# Patient Record
Sex: Male | Born: 1990 | Race: White | Hispanic: No | Marital: Single | Smoking: Never smoker
Health system: Southern US, Community
[De-identification: ages and names within clinical notes are randomized; demographics above are authoritative.]

## PROBLEM LIST (undated history)

## (undated) DIAGNOSIS — K219 Gastro-esophageal reflux disease without esophagitis: Secondary | ICD-10-CM

## (undated) DIAGNOSIS — F419 Anxiety disorder, unspecified: Secondary | ICD-10-CM

## (undated) DIAGNOSIS — G4700 Insomnia, unspecified: Secondary | ICD-10-CM

## (undated) DIAGNOSIS — T7840XA Allergy, unspecified, initial encounter: Secondary | ICD-10-CM

## (undated) HISTORY — DX: Insomnia, unspecified: G47.00

## (undated) HISTORY — DX: Anxiety disorder, unspecified: F41.9

## (undated) HISTORY — PX: APPENDECTOMY: SHX54

## (undated) HISTORY — DX: Allergy, unspecified, initial encounter: T78.40XA

---
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
## 2010-03-11 ENCOUNTER — Encounter: Payer: Self-pay | Admitting: Family Medicine

## 2015-02-25 ENCOUNTER — Encounter (HOSPITAL_BASED_OUTPATIENT_CLINIC_OR_DEPARTMENT_OTHER): Payer: Self-pay | Admitting: *Deleted

## 2015-02-25 ENCOUNTER — Emergency Department (HOSPITAL_BASED_OUTPATIENT_CLINIC_OR_DEPARTMENT_OTHER): Payer: Managed Care, Other (non HMO)

## 2015-02-25 ENCOUNTER — Emergency Department (HOSPITAL_BASED_OUTPATIENT_CLINIC_OR_DEPARTMENT_OTHER)
Admission: EM | Admit: 2015-02-25 | Discharge: 2015-02-25 | Disposition: A | Discharge status: Home / Self Care | Payer: Managed Care, Other (non HMO) | Attending: Emergency Medicine | Admitting: Emergency Medicine

## 2015-02-25 DIAGNOSIS — R112 Nausea with vomiting, unspecified: Secondary | ICD-10-CM | POA: Diagnosis not present

## 2015-02-25 DIAGNOSIS — R000 Tachycardia, unspecified: Secondary | ICD-10-CM | POA: Insufficient documentation

## 2015-02-25 DIAGNOSIS — Z87891 Personal history of nicotine dependence: Secondary | ICD-10-CM | POA: Diagnosis not present

## 2015-02-25 DIAGNOSIS — R509 Fever, unspecified: Secondary | ICD-10-CM | POA: Insufficient documentation

## 2015-02-25 DIAGNOSIS — R197 Diarrhea, unspecified: Secondary | ICD-10-CM | POA: Insufficient documentation

## 2015-02-25 LAB — COMPREHENSIVE METABOLIC PANEL
ALK PHOS: 41 U/L (ref 38–126)
ALT: 46 U/L (ref 17–63)
ANION GAP: 11 (ref 5–15)
AST: 29 U/L (ref 15–41)
Albumin: 4.8 g/dL (ref 3.5–5.0)
BUN: 22 mg/dL — ABNORMAL HIGH (ref 6–20)
CALCIUM: 9.5 mg/dL (ref 8.9–10.3)
CHLORIDE: 102 mmol/L (ref 101–111)
CO2: 23 mmol/L (ref 22–32)
Creatinine, Ser: 0.89 mg/dL (ref 0.61–1.24)
GFR calc non Af Amer: >60 mL/min (ref >60)
Glucose, Bld: 133 mg/dL — ABNORMAL HIGH (ref 65–99)
POTASSIUM: 3.8 mmol/L (ref 3.5–5.1)
SODIUM: 136 mmol/L (ref 135–145)
Total Bilirubin: 1.6 mg/dL — ABNORMAL HIGH (ref 0.3–1.2)
Total Protein: 8.5 g/dL — ABNORMAL HIGH (ref 6.5–8.1)

## 2015-02-25 LAB — URINALYSIS, ROUTINE W REFLEX MICROSCOPIC
Bilirubin Urine: NEGATIVE
Glucose, UA: NEGATIVE mg/dL
HGB URINE DIPSTICK: NEGATIVE
Ketones, ur: NEGATIVE mg/dL
Leukocytes, UA: NEGATIVE
Nitrite: NEGATIVE
Protein, ur: NEGATIVE mg/dL
SPECIFIC GRAVITY, URINE: 1.039 — AB (ref 1.005–1.030)
pH: 5.5 (ref 5.0–8.0)

## 2015-02-25 LAB — CBC
HEMATOCRIT: 47.1 % (ref 39.0–52.0)
HEMOGLOBIN: 15.8 g/dL (ref 13.0–17.0)
MCH: 27.9 pg (ref 26.0–34.0)
MCHC: 33.5 g/dL (ref 30.0–36.0)
MCV: 83.1 fL (ref 78.0–100.0)
Platelets: 441 10*3/uL — ABNORMAL HIGH (ref 150–400)
RBC: 5.67 MIL/uL (ref 4.22–5.81)
RDW: 12.5 % (ref 11.5–15.5)
WBC: 15.4 10*3/uL — ABNORMAL HIGH (ref 4.0–10.5)

## 2015-02-25 LAB — LIPASE, BLOOD: Lipase: 17 U/L (ref 11–51)

## 2015-02-25 MED ORDER — IOHEXOL 300 MG/ML  SOLN
25.0000 mL | Freq: Once | INTRAMUSCULAR | Status: AC | PRN
  Administered 2015-02-25: 25 mL via ORAL

## 2015-02-25 MED ORDER — SODIUM CHLORIDE 0.9 % IV BOLUS (SEPSIS)
2000.0000 mL | Freq: Once | INTRAVENOUS | Status: AC
  Administered 2015-02-25: 2000 mL via INTRAVENOUS

## 2015-02-25 MED ORDER — ONDANSETRON HCL 4 MG PO TABS: 4.0000 mg | ORAL_TABLET | Freq: Four times a day (QID) | ORAL | Status: DC

## 2015-02-25 MED ORDER — MORPHINE SULFATE (PF) 4 MG/ML IV SOLN
4.0000 mg | Freq: Once | INTRAVENOUS | Status: AC
  Administered 2015-02-25: 4 mg via INTRAVENOUS
  Filled 2015-02-25: qty 1

## 2015-02-25 MED ORDER — ONDANSETRON HCL 4 MG/2ML IJ SOLN
4.0000 mg | Freq: Once | INTRAMUSCULAR | Status: AC
  Filled 2015-02-25: qty 2

## 2015-02-25 MED ORDER — ONDANSETRON HCL 4 MG/2ML IJ SOLN
4.0000 mg | Freq: Once | INTRAMUSCULAR | Status: AC | PRN
  Administered 2015-02-25: 4 mg via INTRAVENOUS
  Filled 2015-02-25: qty 2

## 2015-02-25 MED ORDER — IOHEXOL 300 MG/ML  SOLN
100.0000 mL | Freq: Once | INTRAMUSCULAR | Status: AC | PRN
  Administered 2015-02-25: 100 mL via INTRAVENOUS

## 2015-02-25 NOTE — Discharge Instructions (Signed)
Nausea and Vomiting  Nausea means you feel sick to your stomach. Throwing up (vomiting) is a reflex where stomach contents come out of your mouth.  HOME CARE   · Take medicine as told by your doctor.  · Do not force yourself to eat. However, you do need to drink fluids.  · If you feel like eating, eat a normal diet as told by your doctor.    Eat rice, wheat, potatoes, bread, lean meats, yogurt, fruits, and vegetables.    Avoid high-fat foods.  · Drink enough fluids to keep your pee (urine) clear or pale yellow.  · Ask your doctor how to replace body fluid losses (rehydrate). Signs of body fluid loss (dehydration) include:    Feeling very thirsty.    Dry lips and mouth.    Feeling dizzy.    Dark pee.    Peeing less than normal.    Feeling confused.    Fast breathing or heart rate.  GET HELP RIGHT AWAY IF:   · You have blood in your throw up.  · You have black or bloody poop (stool).  · You have a bad headache or stiff neck.  · You feel confused.  · You have bad belly (abdominal) pain.  · You have chest pain or trouble breathing.  · You do not pee at least once every 8 hours.  · You have cold, clammy skin.  · You keep throwing up after 24 to 48 hours.  · You have a fever.  MAKE SURE YOU:   · Understand these instructions.  · Will watch your condition.  · Will get help right away if you are not doing well or get worse.     This information is not intended to replace advice given to you by your health care provider. Make sure you discuss any questions you have with your health care provider.     Document Released: 07/23/2007 Document Revised: 04/28/2011 Document Reviewed: 07/05/2010  Elsevier Interactive Patient Education ©2016 [employer]

## 2015-02-25 NOTE — ED Provider Notes (Signed)
CSN: [id number]     Arrival date & time 02/25/15  1818 History   First MD Initiated Contact with Patient 02/25/15 1934     Chief Complaint  Patient presents with  . Diarrhea     (Consider location/radiation/quality/duration/timing/severity/associated sxs/prior Treatment) Patient is a 25 y.o. male presenting with diarrhea and general illness. The history is provided by the patient.  Diarrhea Associated symptoms: fever and vomiting   Associated symptoms: no abdominal pain, no arthralgias, no chills, no headaches and no myalgias   Illness Severity:  Moderate Onset quality:  Gradual Duration:  2 days Timing:  Constant Progression:  Worsening Chronicity:  New Associated symptoms: diarrhea, fever, nausea and vomiting   Associated symptoms: no abdominal pain, no chest pain, no congestion, no headaches, no myalgias, no rash and no shortness of breath    25 yo M with a chief complaint of nausea vomiting diarrhea and fever. The started this morning. Is recurrent from about a month ago. Patient states last time he is started on antibiotics and had quick resolution of his symptoms. Has been feeling like he is going to pass out as well. Has tried eat and drink but without relief. Crampy abdominal pain. Feels that all over. Denies radiation. Denies dysuria or flank pain.  History reviewed. No pertinent past medical history. Past Surgical History  Procedure Laterality Date  . Appendectomy     No family history on file. Social History  Substance Use Topics  . Smoking status: Former Games developer  . Smokeless tobacco: Never Used  . Alcohol Use: Yes     Comment: denies current use    Review of Systems  Constitutional: Positive for fever. Negative for chills.  HENT: Negative for congestion and facial swelling.   Eyes: Negative for discharge and visual disturbance.  Respiratory: Negative for shortness of breath.   Cardiovascular: Negative for chest pain and palpitations.  Gastrointestinal: Positive  for nausea, vomiting and diarrhea. Negative for abdominal pain.  Musculoskeletal: Negative for myalgias and arthralgias.  Skin: Negative for color change and rash.  Neurological: Negative for tremors, syncope and headaches.  Psychiatric/Behavioral: Negative for confusion and dysphoric mood.      Allergies  Review of patient's allergies indicates no known allergies.  Home Medications   Prior to Admission medications   Medication Sig Start Date End Date Taking? Authorizing Provider  ondansetron (ZOFRAN) 4 MG tablet Take 1 tablet (4 mg total) by mouth every 6 (six) hours. 02/25/15   Melene Plan, DO   BP 110/56 mmHg  Pulse 108  Temp(Src) 98.6 F (37 C) (Oral)  Resp 18  Ht 5\' 10"  (1.778 m)  Wt 178 lb (80.74 kg)  BMI 25.54 kg/m2  SpO2 100% Physical Exam  Constitutional: He is oriented to person, place, and time. He appears well-developed and well-nourished.  HENT:  Head: Normocephalic and atraumatic.  Eyes: EOM are normal. Pupils are equal, round, and reactive to light.  Neck: Normal range of motion. Neck supple. No JVD present.  Cardiovascular: Regular rhythm.  Tachycardia present.  Exam reveals no gallop and no friction rub.   No murmur heard. Pulmonary/Chest: No respiratory distress. He has no wheezes.  Abdominal: He exhibits no distension. There is tenderness. There is no rebound and no guarding.    Musculoskeletal: Normal range of motion.  Neurological: He is alert and oriented to person, place, and time.  Skin: No rash noted. No pallor.  Psychiatric: He has a normal mood and affect. His behavior is normal.  Nursing note and vitals  reviewed.   ED Course  Procedures (including critical care time) Labs Review Labs Reviewed  COMPREHENSIVE METABOLIC PANEL - Abnormal; Notable for the following:    Glucose, Bld 133 (*)    BUN 22 (*)    Total Protein 8.5 (*)    Total Bilirubin 1.6 (*)    All other components within normal limits  CBC - Abnormal; Notable for the following:     WBC 15.4 (*)    Platelets 441 (*)    All other components within normal limits  URINALYSIS, ROUTINE W REFLEX MICROSCOPIC (NOT AT [facility]) - Abnormal; Notable for the following:    Color, Urine AMBER (*)    Specific Gravity, Urine 1.039 (*)    All other components within normal limits  LIPASE, BLOOD    Imaging Review Ct Abdomen Pelvis W Contrast  02/25/2015  CLINICAL DATA:  Left lower quadrant pain. Fever, vomiting, diarrhea and fatigue since this morning. EXAM: CT ABDOMEN AND PELVIS WITH CONTRAST TECHNIQUE: Multidetector CT imaging of the abdomen and pelvis was performed using the standard protocol following bolus administration of intravenous contrast. CONTRAST:  25mL OMNIPAQUE IOHEXOL 300 MG/ML SOLN, 100mL OMNIPAQUE IOHEXOL 300 MG/ML SOLN COMPARISON:  None. FINDINGS: Lower chest:  The included lung bases are clear. Liver: Diffusely decreased density, with minimal sparing about the gallbladder fossa. No focal lesion. Hepatobiliary: Gallbladder physiologically distended, no calcified stone. No biliary dilatation. Pancreas: No ductal dilatation or inflammation. Spleen: Normal in size and density.  Splenule noted inferiorly. Adrenal glands: No nodule. Kidneys: Symmetric renal enhancement and excretion. No hydronephrosis. No perinephric stranding. There is 1.5 cm simple cyst in the mid upper left kidney. Stomach/Bowel: Stomach physiologically distended. There are no dilated or thickened small bowel loops. Small volume of stool throughout the colon without colonic wall thickening. Probable minimal liquid stool in the transverse colon. No colonic inflammation. The appendix is not seen, surgically absent per report. Vascular/Lymphatic: No retroperitoneal adenopathy. Abdominal aorta is normal in caliber. Reproductive: Prostate gland normal in size. Bladder: Physiologically distended. No wall thickening. No bladder stones. Other: No free air, free fluid, or intra-abdominal fluid collection. No inguinal or  umbilical hernia. Musculoskeletal: There are no acute or suspicious osseous abnormalities. IMPRESSION: No acute abnormality in the abdomen/pelvis. Hepatic steatosis. Electronically Signed   By: Rubye OaksMelanie  Ehinger M.D.   On: 02/25/2015 22:06   I have personally reviewed and evaluated these images and lab results as part of my medical decision-making.   EKG Interpretation None      MDM   Final diagnoses:  Nausea vomiting and diarrhea    25 yo M with a chief complaint of nausea vomiting diarrhea and fever. Likely gastroenteritis based on history. However patient with significant left lower quadrant abdominal pain as well as tachycardia to the 120s on arrival. Will obtain a CT scan to evaluate for the possibility of Crohn's disease with recurrent event from one month ago. Laboratory evaluation unremarkable.  CT negative. Tachycardia improved with IV fluids. D/c home.  11:04 PM:  I have discussed the diagnosis/risks/treatment options with the patient and family and believe the pt to be eligible for discharge home to follow-up with PCP. We also discussed returning to the ED immediately if new or worsening sx occur. We discussed the sx which are most concerning (e.g., sudden worsening pain, fever, inability to tolerate by mouth) that necessitate immediate return. Medications administered to the patient during their visit and any new prescriptions provided to the patient are listed below.  Medications given during this  visit Medications  ondansetron (ZOFRAN) injection 4 mg (4 mg Intravenous Given 02/25/15 1940)  sodium chloride 0.9 % bolus 2,000 mL (0 mLs Intravenous Stopped 02/25/15 2049)  morphine 4 MG/ML injection 4 mg (4 mg Intravenous Given 02/25/15 2044)  ondansetron (ZOFRAN) injection 4 mg (0 mg Intravenous Duplicate 02/25/15 2236)  iohexol (OMNIPAQUE) 300 MG/ML solution 25 mL (25 mLs Oral Contrast Given 02/25/15 2020)  iohexol (OMNIPAQUE) 300 MG/ML solution 100 mL (100 mLs Intravenous Contrast Given  02/25/15 2143)    Discharge Medication List as of 02/25/2015 10:12 PM    START taking these medications   Details  ondansetron (ZOFRAN) 4 MG tablet Take 1 tablet (4 mg total) by mouth every 6 (six) hours., Starting 02/25/2015, Until Discontinued, Print        The patient appears reasonably screen and/or stabilized for discharge and I doubt any other medical condition or other [facility] requiring further screening, evaluation, or treatment in the ED at this time prior to discharge.    Melene Plan, DO 02/25/15 2304

## 2015-02-25 NOTE — ED Notes (Signed)
Pt reports fever, vomiting, diarrhea, fatigue since 1000 this morning. Pt has taken phenergan and amoxicillin from an old prescription today. Reports emesis x 2 and diarrhea multiple times

## 2015-03-23 ENCOUNTER — Emergency Department (HOSPITAL_COMMUNITY): Payer: Managed Care, Other (non HMO)

## 2015-03-23 ENCOUNTER — Encounter (HOSPITAL_COMMUNITY): Payer: Self-pay | Admitting: Emergency Medicine

## 2015-03-23 ENCOUNTER — Emergency Department (HOSPITAL_COMMUNITY)
Admission: EM | Admit: 2015-03-23 | Discharge: 2015-03-23 | Disposition: A | Discharge status: Home / Self Care | Payer: Managed Care, Other (non HMO) | Attending: Emergency Medicine | Admitting: Emergency Medicine

## 2015-03-23 DIAGNOSIS — Y990 Civilian activity done for income or pay: Secondary | ICD-10-CM | POA: Insufficient documentation

## 2015-03-23 DIAGNOSIS — W208XXA Other cause of strike by thrown, projected or falling object, initial encounter: Secondary | ICD-10-CM | POA: Insufficient documentation

## 2015-03-23 DIAGNOSIS — Y9289 Other specified places as the place of occurrence of the external cause: Secondary | ICD-10-CM | POA: Insufficient documentation

## 2015-03-23 DIAGNOSIS — S9031XA Contusion of right foot, initial encounter: Secondary | ICD-10-CM | POA: Insufficient documentation

## 2015-03-23 DIAGNOSIS — Z79899 Other long term (current) drug therapy: Secondary | ICD-10-CM | POA: Insufficient documentation

## 2015-03-23 DIAGNOSIS — S9001XA Contusion of right ankle, initial encounter: Secondary | ICD-10-CM | POA: Insufficient documentation

## 2015-03-23 DIAGNOSIS — Z87891 Personal history of nicotine dependence: Secondary | ICD-10-CM | POA: Insufficient documentation

## 2015-03-23 DIAGNOSIS — Y9389 Activity, other specified: Secondary | ICD-10-CM | POA: Insufficient documentation

## 2015-03-23 NOTE — ED Notes (Signed)
Patient states he was at work and was working with a load of lumber.   Patient states that the cart tipped and fell on R ankle/foot.  Patient states approximately 46 pieces of wood that landed on his ankle/foot. Denies other injuries.

## 2015-03-23 NOTE — ED Notes (Signed)
Pt placed in paper shorts

## 2015-03-23 NOTE — ED Provider Notes (Signed)
CSN: [id number]     Arrival date & time 03/23/15  0829 History  By signing my name below, I, Marica Otter, attest that this documentation has been prepared under the direction and in the presence of Cheri Fowler, PA-C. Electronically Signed: Marica Otter, ED Scribe. 03/23/2015. 9:36 AM.    Chief Complaint  Patient presents with  . Ankle Injury  . Foot Injury   The history is provided by the patient. No language interpreter was used.  PCP: No primary care provider on file. HPI Comments: Fred Santos is a 25 y.o. male who presents to the Emergency Department complaining of traumatic, sudden onset, severe, constant right ankle and foot pain onset today after a load of lumber fell on pt's right foot and ankle, causing him to invert his right ankle. Movement aggravates the pain. Associated Sx include trouble walking secondary to pain. Pt denies numbness or weakness.   History reviewed. No pertinent past medical history. Past Surgical History  Procedure Laterality Date  . Appendectomy     No family history on file. Social History  Substance Use Topics  . Smoking status: Former Games developer  . Smokeless tobacco: Never Used  . Alcohol Use: Yes     Comment: denies current use    Review of Systems  Constitutional: Negative for fever.  Musculoskeletal: Positive for arthralgias (right foot and ankle pain) and gait problem.  Neurological: Negative for numbness.  Psychiatric/Behavioral: Negative for confusion.  All other systems reviewed and are negative.  Allergies  Review of patient's allergies indicates no known allergies.  Home Medications   Prior to Admission medications   Medication Sig Start Date End Date Taking? Authorizing Provider  ondansetron (ZOFRAN) 4 MG tablet Take 1 tablet (4 mg total) by mouth every 6 (six) hours. 02/25/15  Yes Melene Plan, DO   Triage Vitals: BP 139/75 mmHg  Pulse 71  Temp(Src) 98 F (36.7 C) (Oral)  Resp 19  Ht  (1.778 m)  Wt 175 lb (79.379 kg)  BMI  25.11 kg/m2  SpO2 97% Physical Exam  Constitutional: He is oriented to person, place, and time. He appears well-developed and well-nourished.  HENT:  Head: Normocephalic and atraumatic.  Eyes: Conjunctivae are normal. No scleral icterus.  Neck: No tracheal deviation present.  Cardiovascular:  Cap refill less than 3 seconds distal to injury   Pulmonary/Chest: Effort normal. No respiratory distress.  Musculoskeletal: He exhibits tenderness.       Right ankle: He exhibits decreased range of motion (secondary to pain) and swelling (mild). He exhibits no ecchymosis. Tenderness (dorsal aspect of mid foot).       Right foot: There is decreased range of motion (secondary to pain) and tenderness.  Decreased ROM of right ankle with flexion and extension due to pain. Pt able to wiggle all toes without difficulty. Compartments are soft and compressible.   Neurological: He is alert and oriented to person, place, and time.  Grossly normal sensation. 5/5 strength RLE.    Skin: Skin is warm and dry.  Skin intact. No abrasions, lacerations, ecchymosis, bruising.   Psychiatric: He has a normal mood and affect. His behavior is normal.    ED Course  Procedures (including critical care time) DIAGNOSTIC STUDIES: Oxygen Saturation is 97% on ra, nl by my interpretation.    COORDINATION OF CARE: 9:17 AM: Discussed treatment plan which includes imaging and pain meds with pt at bedside; patient declined any pain meds at this time.   Imaging Review Dg Ankle Complete Right  03/23/2015  CLINICAL DATA:  Pain about the right lateral malleolus after the patient was struck in the ankle bowel multiple with or ribs. The patient heard a pop. Limited range of motion. The incident occurred today. Initial encounter. EXAM: RIGHT ANKLE - COMPLETE 3+ VIEW COMPARISON:  None. FINDINGS: There is no evidence of fracture, dislocation, or joint effusion. There is no evidence of arthropathy or other focal bone abnormality. Soft  tissues are unremarkable. IMPRESSION: Negative exam. Electronically Signed   By: Drusilla Kanner M.D.   On: 03/23/2015 09:19   Dg Foot Complete Right  03/23/2015  CLINICAL DATA:  Pain in the plantar arch, no known injury, initial encounter EXAM: RIGHT FOOT COMPLETE - 3+ VIEW COMPARISON:  None. FINDINGS: There is no evidence of fracture or dislocation. There is no evidence of arthropathy or other focal bone abnormality. Soft tissues are unremarkable. IMPRESSION: No acute abnormality noted. Electronically Signed   By: Alcide Clever M.D.   On: 03/23/2015 09:19   I have personally reviewed and evaluated these images as part of my medical decision-making.    MDM  Patient X-Ray negative for obvious fracture or dislocation. Pain managed in ED. Pt advised to follow up with orthopedics if symptoms persist for possibility of missed fracture diagnosis. Patient given brace and crutches while in ED, conservative therapy recommended and discussed. Patient will be dc home & is agreeable with above plan.  Final diagnoses:  Ankle contusion, right, initial encounter  Foot contusion, right, initial encounter    I personally performed the services described in this documentation, which was scribed in my presence. The recorded information has been reviewed and is accurate.    Cheri Fowler, PA-C 03/23/15 1610  Marily Memos, MD 03/23/15 [phone]

## 2015-03-23 NOTE — Discharge Instructions (Signed)

## 2017-03-24 ENCOUNTER — Emergency Department (HOSPITAL_COMMUNITY): Payer: Self-pay

## 2017-03-24 ENCOUNTER — Emergency Department (HOSPITAL_COMMUNITY)
Admission: EM | Admit: 2017-03-24 | Discharge: 2017-03-24 | Disposition: A | Discharge status: Home / Self Care | Payer: Self-pay | Attending: Emergency Medicine | Admitting: Emergency Medicine

## 2017-03-24 ENCOUNTER — Encounter (HOSPITAL_COMMUNITY): Payer: Self-pay | Admitting: Emergency Medicine

## 2017-03-24 DIAGNOSIS — Y92838 Other recreation area as the place of occurrence of the external cause: Secondary | ICD-10-CM | POA: Insufficient documentation

## 2017-03-24 DIAGNOSIS — S29019A Strain of muscle and tendon of unspecified wall of thorax, initial encounter: Secondary | ICD-10-CM | POA: Insufficient documentation

## 2017-03-24 DIAGNOSIS — T148XXA Other injury of unspecified body region, initial encounter: Secondary | ICD-10-CM

## 2017-03-24 DIAGNOSIS — Z87891 Personal history of nicotine dependence: Secondary | ICD-10-CM | POA: Insufficient documentation

## 2017-03-24 DIAGNOSIS — Y9321 Activity, ice skating: Secondary | ICD-10-CM | POA: Insufficient documentation

## 2017-03-24 DIAGNOSIS — Y998 Other external cause status: Secondary | ICD-10-CM | POA: Insufficient documentation

## 2017-03-24 MED ORDER — OXYCODONE HCL 5 MG PO TABS
5.0000 mg | ORAL_TABLET | Freq: Once | ORAL | Status: AC
  Administered 2017-03-24: 5 mg via ORAL
  Filled 2017-03-24: qty 1

## 2017-03-24 MED ORDER — METHOCARBAMOL 500 MG PO TABS: 500.0000 mg | ORAL_TABLET | Freq: Two times a day (BID) | ORAL | 0 refills | Status: DC | PRN

## 2017-03-24 MED ORDER — METHOCARBAMOL 500 MG PO TABS
500.0000 mg | ORAL_TABLET | Freq: Once | ORAL | Status: AC
  Administered 2017-03-24: 500 mg via ORAL
  Filled 2017-03-24: qty 1

## 2017-03-24 MED ORDER — ACETAMINOPHEN 500 MG PO TABS
1000.0000 mg | ORAL_TABLET | Freq: Once | ORAL | Status: AC
  Administered 2017-03-24: 1000 mg via ORAL
  Filled 2017-03-24: qty 2

## 2017-03-24 MED FILL — METHOCARBAMOL 500 MG TABS: 500 | 10 days supply | Qty: 20 | Fill #0

## 2017-03-24 NOTE — ED Triage Notes (Signed)
Patient presents ambulatory states 2 weeks ago he fell ice skating and had left sided rib cage pain. Patient states last night while working on cars he felt "a snap in my back." denies any bladder or bowel issues. Pain is mid back that shoots into lower back.

## 2017-03-24 NOTE — Discharge Instructions (Signed)
It was my pleasure taking care of you today!   Fortunately, your x-ray did not show any evidence of injury.  Continue taking ibuprofen as needed for pain.  I have given you a prescription for a muscle relaxer to take as needed as well.  Ice or heat to the affected area.  Follow-up with your primary care provider if symptoms are not improving in the next week.  Return to ER for new or worsening symptoms, any additional concerns.

## 2017-03-24 NOTE — ED Provider Notes (Signed)
[facility]-EMERGENCY DEPT Provider Note   CSN: [id number] Arrival date & time: 03/24/17  1024     History   Chief Complaint Chief Complaint  Patient presents with  . Back Pain    HPI Fred Santos is a 27 y.o. male.  The history is provided by the patient and medical records. No language interpreter was used.   Fred Santos is an otherwise healthy 27 y.o. male who presents to ED for persistent left-sided rib cage pain x 2 weeks. Patient states that he initial hurt the area when he fell on his left side while ice skating. He took 800mg  ibuprofen about 3x per day and felt as if things were healing well. Last night, he was working on his car when he felt a snap to the left lateral rib area. Pain radiated around to his mid back. Since then, pain worse with taking a deep breath and movement. He took ibuprofen just prior to arrival with little improvement. Patient denies neck pain. No fever, saddle anesthesia, weakness, numbness, urinary complaints including retention/incontinence.  History reviewed. No pertinent past medical history.  There are no active problems to display for this patient.   Past Surgical History:  Procedure Laterality Date  . APPENDECTOMY         Home Medications    Prior to Admission medications   Medication Sig Start Date End Date Taking? Authorizing Provider  ibuprofen (ADVIL,MOTRIN) 200 MG tablet Take 800 mg by mouth every 6 (six) hours as needed for mild pain, moderate pain or cramping.   Yes [provider]  methocarbamol (ROBAXIN) 500 MG tablet Take 1 tablet (500 mg total) by mouth 2 (two) times daily as needed for muscle spasms. 03/24/17   Batsheva Stevick, Chase Picket, PA-C  ondansetron (ZOFRAN) 4 MG tablet Take 1 tablet (4 mg total) by mouth every 6 (six) hours. Patient not taking: Reported on 03/24/2017 02/25/15   Melene Plan, DO    Family History No family history on file.  Social History Social History   Tobacco Use  . Smoking  status: Former Games developer  . Smokeless tobacco: Never Used  Substance Use Topics  . Alcohol use: Yes    Comment: denies current use  . Drug use: No     Allergies   Patient has no known allergies.   Review of Systems Review of Systems  Constitutional: Negative for fever.  Respiratory: Negative for cough and shortness of breath.   Cardiovascular: Negative for chest pain.  Gastrointestinal: Negative for abdominal pain, nausea and vomiting.  Genitourinary: Negative for difficulty urinating.  Musculoskeletal: Positive for arthralgias and myalgias. Negative for neck pain.  Skin: Negative for color change and wound.  Neurological: Negative for weakness and numbness.     Physical Exam Updated Vital Signs BP 134/87 (BP Location: Left Arm)   Pulse 71   Temp 98.2 F (36.8 C) (Oral)   Resp 16   SpO2 100%   Physical Exam  Constitutional: He appears well-developed and well-nourished. No distress.  HENT:  Head: Normocephalic and atraumatic.  Neck: Neck supple.  Cardiovascular: Normal rate, regular rhythm and normal heart sounds.  No murmur heard. Pulmonary/Chest: Effort normal and breath sounds normal. No respiratory distress. He has no wheezes. He has no rales.  Musculoskeletal: Normal range of motion.  Tenderness to palpation along left lateral rib cage. No crepitus or deformity appreciated. No overlying skin changes. No midline C/T/L spine tenderness. Straight leg raises negative bilaterally.  Neurological: He is alert.  All four  extremities neurovascularly intact.  Skin: Skin is warm and dry.  Nursing note and vitals reviewed.    ED Treatments / Results  Labs (all labs ordered are listed, but only abnormal results are displayed) Labs Reviewed - No data to display  EKG  EKG Interpretation None       Radiology Dg Ribs Unilateral W/chest Left  Result Date: 03/24/2017 CLINICAL DATA:  Fall 2 weeks ago. Complains of left posterior lower rib pain. BB was placed at the area  of concern. EXAM: LEFT RIBS AND CHEST - 3+ VIEW COMPARISON:  09/12/2016 FINDINGS: Both lungs are clear. Negative for a pneumothorax. Heart size is normal. Trachea is midline. Left ribs are intact without a fracture. IMPRESSION: Negative. Electronically Signed   By: Richarda OverlieAdam  Henn M.D.   On: 03/24/2017 13:19    Procedures Procedures (including critical care time)  Medications Ordered in ED Medications  methocarbamol (ROBAXIN) tablet 500 mg (500 mg Oral Given 03/24/17 1242)  acetaminophen (TYLENOL) tablet 1,000 mg (1,000 mg Oral Given 03/24/17 1242)  oxyCODONE (Oxy IR/ROXICODONE) immediate release tablet 5 mg (5 mg Oral Given 03/24/17 1242)     Initial Impression / Assessment and Plan / ED Course  I have reviewed the triage vital signs and the nursing notes.  Pertinent labs & imaging results that were available during my care of the patient were reviewed by me and considered in my medical decision making (see chart for details).    Fred Santos is a 27 y.o. male who presents to ED for left-sided rib cage pain after mechanical fall 2 weeks ago, which acutely worsened while working on his car last night.  No crepitus or deformity on exam, but is point tender to left rib cage.  No midline C/T/L-spine tenderness.  All extremities neurovascularly intact.  X-ray negative.  Will treat symptomatically and have patient follow-up with PCP if symptoms persist.  Reasons to return to ER discussed and all questions answered.   Final Clinical Impressions(s) / ED Diagnoses   Final diagnoses:  Muscle strain    ED Discharge Orders        Ordered    methocarbamol (ROBAXIN) 500 MG tablet  2 times daily PRN     03/24/17 1355       Zeniyah Peaster, Chase PicketJaime Pilcher, PA-C 03/24/17 1357    Linwood DibblesKnapp, Jon, MD 03/25/17 [phone]

## 2017-06-03 ENCOUNTER — Ambulatory Visit (HOSPITAL_COMMUNITY)
Admission: EM | Admit: 2017-06-03 | Discharge: 2017-06-03 | Disposition: A | Discharge status: Home / Self Care | Payer: Self-pay | Attending: Urgent Care | Admitting: Urgent Care

## 2017-06-03 ENCOUNTER — Encounter (HOSPITAL_COMMUNITY): Payer: Self-pay | Admitting: Family Medicine

## 2017-06-03 DIAGNOSIS — R531 Weakness: Secondary | ICD-10-CM

## 2017-06-03 DIAGNOSIS — G4700 Insomnia, unspecified: Secondary | ICD-10-CM

## 2017-06-03 DIAGNOSIS — R110 Nausea: Secondary | ICD-10-CM

## 2017-06-03 DIAGNOSIS — R519 Headache, unspecified: Secondary | ICD-10-CM

## 2017-06-03 DIAGNOSIS — R51 Headache: Secondary | ICD-10-CM

## 2017-06-03 LAB — POCT I-STAT, CHEM 8
BUN: 20 mg/dL (ref 6–20)
CREATININE: 0.9 mg/dL (ref 0.61–1.24)
Calcium, Ion: 1.19 mmol/L (ref 1.15–1.40)
Chloride: 96 mmol/L — ABNORMAL LOW (ref 101–111)
Glucose, Bld: 135 mg/dL — ABNORMAL HIGH (ref 65–99)
HEMATOCRIT: 42 % (ref 39.0–52.0)
HEMOGLOBIN: 14.3 g/dL (ref 13.0–17.0)
Potassium: 3.4 mmol/L — ABNORMAL LOW (ref 3.5–5.1)
SODIUM: 136 mmol/L (ref 135–145)
TCO2: 27 mmol/L (ref 22–32)

## 2017-06-03 MED ORDER — LORAZEPAM 0.5 MG PO TABS: 0.5000 mg | ORAL_TABLET | Freq: Every day | ORAL | 0 refills | Status: DC

## 2017-06-03 NOTE — ED Triage Notes (Addendum)
Pt was at work today and became very weak and had a headache. He had an instance at work last week where he passed out and then hit hi head.  sts that the blood sugar was 41 when EMS arrived. He went to [facility]. He is not a  Diabetic. sts he recently recovered from viral URI. He is very nauseous, weak and dizzy right now

## 2017-06-03 NOTE — ED Provider Notes (Signed)
MRN: [id number] DOB: Jul 03, 1990  Subjective:   Fred Santos is a 27 y.o. male presenting for malaise, weakness, dizziness, nausea without vomiting, mild headache all over.  Patient states that he is recovering from a viral URI as diagnosed at the hospital.  States that he was seen for passing out and was found to have hypoglycemia.  Patient is denying any medical diagnoses.  He reports that he eats 1-2 times a day.  Hydrates very well.  Patient reports that he has a long-standing history of insomnia worse in the past month.  Reports that he has been sleeping about 1 hour per night.  He is still working a full-time job.  Denies sinus pain, sore throat, ear pain, cough, chest pain, shortness of breath, wheezing, nausea, vomiting, abdominal pain, dysuria, hematuria, urinary frequency, rashes, numbness, tingling.  Overall patient states that he does not feel like himself, reports that he feels sick.  Denies history of HIV, diabetes, autoimmune disorder.  Denies family history of diabetes or autoimmune disorder, cancer.  He is not currently taking any medications and No Known Allergies.  History reviewed. No pertinent past medical history.   Past Surgical History:  Procedure Laterality Date  . APPENDECTOMY      Objective:   Vitals: BP 135/89   Pulse 74   Temp 98.5 F (36.9 C)   Resp 18   SpO2 99%   Physical Exam  Constitutional: He is oriented to person, place, and time. He appears well-developed and well-nourished.  HENT:  Mouth/Throat: Oropharynx is clear and moist.  Eyes: Pupils are equal, round, and reactive to light. EOM are normal. Right eye exhibits no discharge. Left eye exhibits no discharge.  Neck: Normal range of motion. Neck supple. No thyromegaly present.  Cardiovascular: Normal rate, regular rhythm and intact distal pulses. Exam reveals no gallop and no friction rub.  No murmur heard. Pulmonary/Chest: No respiratory distress. He has no wheezes. He has no rales.  Abdominal:  Soft. Bowel sounds are normal. He exhibits no distension and no mass. There is no tenderness. There is no rebound and no guarding.  Musculoskeletal: He exhibits no edema.  Neurological: He is alert and oriented to person, place, and time. He displays normal reflexes. No cranial nerve deficit. Coordination normal.  Skin: Skin is warm and dry. Capillary refill takes less than 2 seconds.  Psychiatric:  Appears very lethargic.    Results for orders placed or performed during the hospital encounter of 06/03/17 (from the past 24 hour(s))  I-STAT, chem 8     Status: Abnormal   Collection Time: 06/03/17  7:41 PM  Result Value Ref Range   Sodium 136 135 - 145 mmol/L   Potassium 3.4 (L) 3.5 - 5.1 mmol/L   Chloride 96 (L) 101 - 111 mmol/L   BUN 20 6 - 20 mg/dL   Creatinine, Ser 6.570.90 0.61 - 1.24 mg/dL   Glucose, Bld 846135 (H) 65 - 99 mg/dL   Calcium, Ion 9.621.19 9.521.15 - 1.40 mmol/L   TCO2 27 22 - 32 mmol/L   Hemoglobin 14.3 13.0 - 17.0 g/dL   HCT 84.142.0 32.439.0 - 40.152.0 %    Assessment and Plan :   Insomnia, unspecified type  Weakness  Nonintractable headache, unspecified chronicity pattern, unspecified headache type  Nausea without vomiting  I suspect patient has an underlying anxiety disorder worse over the past month including symptom of insomnia.  Counseled on importance of managing this with lorazepam.  Patient admits that he has tried many sleep medicines  over-the-counter and feels that they do not work.  He is agreeable to trying Lorazepam.  Patient denies history of drug or substance abuse.  Counseled that he needs to work with his primary care doctor on an evaluation for generalized anxiety.  Return to clinic precautions discussed.   Wallis Bamberg, [place] 06/05/17 1610

## 2017-06-07 ENCOUNTER — Encounter (HOSPITAL_COMMUNITY): Payer: Self-pay | Admitting: *Deleted

## 2017-06-07 ENCOUNTER — Emergency Department (HOSPITAL_COMMUNITY)
Admission: EM | Admit: 2017-06-07 | Discharge: 2017-06-07 | Disposition: A | Discharge status: Home / Self Care | Payer: Self-pay | Attending: Emergency Medicine | Admitting: Emergency Medicine

## 2017-06-07 ENCOUNTER — Ambulatory Visit (HOSPITAL_COMMUNITY): Payer: Self-pay

## 2017-06-07 DIAGNOSIS — Z5321 Procedure and treatment not carried out due to patient leaving prior to being seen by health care provider: Secondary | ICD-10-CM | POA: Insufficient documentation

## 2017-06-07 NOTE — ED Notes (Signed)
LS clear throughout.

## 2017-06-07 NOTE — ED Triage Notes (Signed)
Pt c/o mid to upper back pain that has been going on for years."

## 2017-06-07 NOTE — ED Notes (Signed)
Pt returned stickers to registration and stated that he was leaving.

## 2017-06-17 ENCOUNTER — Encounter: Payer: Self-pay | Admitting: Family Medicine

## 2017-06-17 ENCOUNTER — Ambulatory Visit (INDEPENDENT_AMBULATORY_CARE_PROVIDER_SITE_OTHER): Payer: Self-pay | Admitting: Family Medicine

## 2017-06-17 VITALS — BP 110/72 | HR 75 | Temp 98.8°F | Ht 69.5 in | Wt 185.5 lb

## 2017-06-17 DIAGNOSIS — R059 Cough, unspecified: Secondary | ICD-10-CM

## 2017-06-17 DIAGNOSIS — K219 Gastro-esophageal reflux disease without esophagitis: Secondary | ICD-10-CM

## 2017-06-17 DIAGNOSIS — F411 Generalized anxiety disorder: Secondary | ICD-10-CM

## 2017-06-17 DIAGNOSIS — G479 Sleep disorder, unspecified: Secondary | ICD-10-CM

## 2017-06-17 DIAGNOSIS — R05 Cough: Secondary | ICD-10-CM

## 2017-06-17 MED ORDER — TRAZODONE HCL 50 MG PO TABS: 25.0000 mg | ORAL_TABLET | Freq: Every evening | ORAL | 3 refills | Status: DC | PRN

## 2017-06-17 MED ORDER — ALBUTEROL SULFATE HFA 108 (90 BASE) MCG/ACT IN AERS: 2.0000 | INHALATION_SPRAY | Freq: Four times a day (QID) | RESPIRATORY_TRACT | 0 refills | Status: DC | PRN

## 2017-06-17 MED FILL — ALBUTEROL SULFATE HFA 108 (: 108 (90 BAS | 25 days supply | Qty: 18 | Fill #0

## 2017-06-17 MED FILL — traZODone HCL 50 MG TABS: 50 | 30 days supply | Qty: 30 | Fill #0

## 2017-06-17 NOTE — Progress Notes (Signed)
Pre visit review using our clinic review tool, if applicable. No additional management support is needed unless otherwise documented below in the visit note. 

## 2017-06-17 NOTE — Patient Instructions (Addendum)
Please consider counseling. Contact [phone] schedule an appointment or inquire about cost/insurance coverage.  Coping skills Choose 5 that work for you:  Take a deep breath  Count to 20  Read a book  Do a puzzle  Meditate  Bake  Sing  Knit  Garden  Pray  Go outside  Call a friend  Listen to music  Take a walk  Color  Send a note  Take a bath  Watch a movie  Be alone in a quiet place  Pet an animal  Visit a friend  Journal  Exercise  Stretch   Let me know if medicines are too expensive.   Let us know if you need anything.

## 2017-06-17 NOTE — Progress Notes (Signed)
Chief Complaint  Patient presents with  . Establish Care       New Patient Visit SUBJECTIVE: HPI: Fred Santos is an 27 y.o.male who is being seen for establishing care.  Pt has not had pcp perhaps ever.  He does not currently have insurance.  The patient has had a dry cough over the past month.  It happens throughout the day.  No other associated symptoms he can think of.  While he does have a history of allergies, he does not feel it is related to that.  He has not tried anything at home.  Denies any history of asthma.  He is not having any shortness of breath, chest pain, wheezing, or fevers.  He does have a history of reflux, but states that it does not appear to be associated with meals or position.  The patient has a history of anxiety and associated insomnia.  He is currently on Ativan prescribed by an urgent care provider.  He uses it as needed to help him sleep.  He does have a questionable history of apnea.  He was set to undergo a sleep test but they had to stop it due to a possible night terror.  He is not following with a counselor or psychologist, however is very interested in this option.  He cannot afford it at this time.  No Known Allergies  Past Medical History:  Diagnosis Date  . Allergy   . Anxiety   . Insomnia    Past Surgical History:  Procedure Laterality Date  . APPENDECTOMY     History reviewed. No pertinent family history.   Current Outpatient Medications:  .  albuterol (PROVENTIL HFA;VENTOLIN HFA) 108 (90 Base) MCG/ACT inhaler, Inhale 2 puffs into the lungs every 6 (six) hours as needed for wheezing or shortness of breath., Disp: 1 Inhaler, Rfl: 0 .  traZODone (DESYREL) 50 MG tablet, Take 0.5-1 tablets (25-50 mg total) by mouth at bedtime as needed for sleep., Disp: 30 tablet, Rfl: 3  ROS Lungs: +cough  Psych: +insomnia   OBJECTIVE: BP 110/72 (BP Location: Left Arm, Patient Position: Sitting, Cuff Size: Normal)   Pulse 75   Temp 98.8 F (37.1 C)  (Oral)   Ht 5' 9.5" (1.765 m)   Wt 185 lb 8 oz (84.1 kg)   SpO2 95%   BMI 27.00 kg/m   Constitutional: -  VS reviewed -  Well developed, well nourished, appears stated age -  No apparent distress  Psychiatric: -  Oriented to person, place, and time -  Memory intact -  Affect and mood normal -  Fluent conversation, good eye contact -  Judgment and insight age appropriate  Eye: -  Conjunctivae clear, no discharge -  Pupils symmetric, round, reactive to light  ENMT: -  MMM    Pharynx moist, no exudate, no erythema  Neck: -  No gross swelling, no palpable masses -  Thyroid midline, not enlarged, mobile, no palpable masses  Cardiovascular: -  RRR -  No LE edema  Respiratory: -  Normal respiratory effort, no accessory muscle use, no retraction -  Breath sounds equal, no wheezes, no ronchi, no crackles  Skin: -  No significant lesion on inspection -  Warm and dry to palpation   ASSESSMENT/PLAN: Cough - Plan: albuterol (PROVENTIL HFA;VENTOLIN HFA) 108 (90 Base) MCG/ACT inhaler  Gastroesophageal reflux disease, esophagitis presence not specified  GAD (generalized anxiety disorder) - Plan: traZODone (DESYREL) 50 MG tablet  Sleep disorder  Trial albuterol inhaler.  Given his history of allergies, well-controlled asthma exacerbated by allergens could be possible given the time of year.  It does not sound like reflux, however will consider if this is not improved. Stop Ativan for now given questionable sleep apnea.  Will start trazodone instead.  Counseling information provided as well as coping mechanisms. I would like for him to see a sleep specialist regarding his night terrors, insomnia, and possible sleep apnea.  That being said, I will await him having coverage. Patient should return in 1 mo to recheck. The patient voiced understanding and agreement to the plan.   Jilda Roche [place], DO 06/17/17  12:26 PM

## 2017-06-20 ENCOUNTER — Encounter: Payer: Self-pay | Admitting: Family Medicine

## 2017-07-03 ENCOUNTER — Ambulatory Visit: Payer: Self-pay | Admitting: *Deleted

## 2017-07-03 ENCOUNTER — Encounter (HOSPITAL_BASED_OUTPATIENT_CLINIC_OR_DEPARTMENT_OTHER): Payer: Self-pay | Admitting: Emergency Medicine

## 2017-07-03 ENCOUNTER — Other Ambulatory Visit: Payer: Self-pay

## 2017-07-03 ENCOUNTER — Emergency Department (HOSPITAL_BASED_OUTPATIENT_CLINIC_OR_DEPARTMENT_OTHER)
Admission: EM | Admit: 2017-07-03 | Discharge: 2017-07-03 | Disposition: A | Discharge status: Home / Self Care | Payer: Medicaid Other | Attending: Emergency Medicine | Admitting: Emergency Medicine

## 2017-07-03 DIAGNOSIS — F419 Anxiety disorder, unspecified: Secondary | ICD-10-CM | POA: Diagnosis not present

## 2017-07-03 DIAGNOSIS — Z87891 Personal history of nicotine dependence: Secondary | ICD-10-CM | POA: Diagnosis not present

## 2017-07-03 DIAGNOSIS — R0789 Other chest pain: Secondary | ICD-10-CM | POA: Diagnosis not present

## 2017-07-03 DIAGNOSIS — R079 Chest pain, unspecified: Secondary | ICD-10-CM | POA: Diagnosis present

## 2017-07-03 LAB — CBC WITH DIFFERENTIAL/PLATELET
Basophils Absolute: 0 10*3/uL (ref 0.0–0.1)
Basophils Relative: 0 %
Eosinophils Absolute: 0.1 10*3/uL (ref 0.0–0.7)
Eosinophils Relative: 1 %
HEMATOCRIT: 45.2 % (ref 39.0–52.0)
HEMOGLOBIN: 16.3 g/dL (ref 13.0–17.0)
LYMPHS PCT: 18 %
Lymphs Abs: 1.9 10*3/uL (ref 0.7–4.0)
MCH: 29.7 pg (ref 26.0–34.0)
MCHC: 36.1 g/dL — ABNORMAL HIGH (ref 30.0–36.0)
MCV: 82.3 fL (ref 78.0–100.0)
Monocytes Absolute: 0.8 10*3/uL (ref 0.1–1.0)
Monocytes Relative: 8 %
NEUTROS PCT: 73 %
Neutro Abs: 7.4 10*3/uL (ref 1.7–7.7)
Platelets: 364 10*3/uL (ref 150–400)
RBC: 5.49 MIL/uL (ref 4.22–5.81)
RDW: 12.5 % (ref 11.5–15.5)
WBC: 10.2 10*3/uL (ref 4.0–10.5)

## 2017-07-03 LAB — BASIC METABOLIC PANEL
Anion gap: 6 (ref 5–15)
BUN: 16 mg/dL (ref 6–20)
CHLORIDE: 105 mmol/L (ref 101–111)
CO2: 24 mmol/L (ref 22–32)
Calcium: 9.2 mg/dL (ref 8.9–10.3)
Creatinine, Ser: 1.01 mg/dL (ref 0.61–1.24)
GFR calc Af Amer: >60 mL/min (ref >60)
GFR calc non Af Amer: >60 mL/min (ref >60)
GLUCOSE: 100 mg/dL — AB (ref 65–99)
Potassium: 4 mmol/L (ref 3.5–5.1)
Sodium: 135 mmol/L (ref 135–145)

## 2017-07-03 LAB — TROPONIN I: Troponin I: <0.03 ng/mL (ref <0.03)

## 2017-07-03 NOTE — Discharge Instructions (Addendum)
If you were given medicines take as directed.  If you are on coumadin or contraceptives realize their levels and effectiveness is altered by many different medicines.  If you have any reaction (rash, tongues swelling, other) to the medicines stop taking and see a physician.    If your blood pressure was elevated in the ER make sure you follow up for management with a primary doctor or return for chest pain, shortness of breath or stroke symptoms.  Please follow up as directed and return to the ER or see a physician for new or worsening symptoms.  Thank you. Vitals:   07/03/17 1222 07/03/17 1223 07/03/17 1320  BP: 126/89  124/86  Pulse: 76  72  Resp: 18  14  Temp: 98.4 F (36.9 C)    TempSrc: Oral    SpO2: 100%  96%  Weight:  83 kg (183 lb)   Height:   (1.753 m)

## 2017-07-03 NOTE — ED Provider Notes (Signed)
[facility] EMERGENCY DEPARTMENT Provider Note   CSN: [id number] Arrival date & time: 07/03/17  1216     History   Chief Complaint Chief Complaint  Patient presents with  . Chest Pain    HPI Fred Santos is a 27 y.o. male.  Patient with history of anxiety, allergy presents after intermittent chest pain/anxiety attacks for the past few days. No specific cause. Similar to previous. Chest tightness bilateral. Patient denies cardiac or blood clot risk factors. No current cigarette smoker.currently symptoms mild. No leg edema.     Past Medical History:  Diagnosis Date  . Allergy   . Anxiety   . Insomnia     Patient Active Problem List   Diagnosis Date Noted  . Gastroesophageal reflux disease 06/17/2017  . Sleep disorder 06/17/2017    Past Surgical History:  Procedure Laterality Date  . APPENDECTOMY          Home Medications    Prior to Admission medications   Medication Sig Start Date End Date Taking? Authorizing Provider  albuterol (PROVENTIL HFA;VENTOLIN HFA) 108 (90 Base) MCG/ACT inhaler Inhale 2 puffs into the lungs every 6 (six) hours as needed for wheezing or shortness of breath. 06/17/17   Sharlene Dory, DO  traZODone (DESYREL) 50 MG tablet Take 0.5-1 tablets (25-50 mg total) by mouth at bedtime as needed for sleep. 06/17/17   Sharlene Dory, DO    Family History No family history on file.  Social History Social History   Tobacco Use  . Smoking status: Former Games developer  . Smokeless tobacco: Never Used  Substance Use Topics  . Alcohol use: Yes    Comment: denies current use  . Drug use: No     Allergies   Patient has no known allergies.   Review of Systems Review of Systems  Constitutional: Negative for chills and fever.  HENT: Negative for congestion.   Eyes: Negative for visual disturbance.  Respiratory: Negative for shortness of breath.   Cardiovascular: Positive for chest pain. Negative for leg swelling.    Gastrointestinal: Negative for abdominal pain and vomiting.  Musculoskeletal: Negative for neck pain and neck stiffness.  Skin: Negative for rash.  Neurological: Negative for light-headedness and headaches.  Psychiatric/Behavioral: The patient is nervous/anxious.      Physical Exam Updated Vital Signs BP 124/86 (BP Location: Right Arm)   Pulse 72   Temp 98.4 F (36.9 C) (Oral)   Resp 14   Ht  (1.753 m)   Wt 83 kg (183 lb)   SpO2 96%   BMI 27.02 kg/m   Physical Exam  Constitutional: He is oriented to person, place, and time. He appears well-developed and well-nourished.  HENT:  Head: Normocephalic and atraumatic.  Eyes: Conjunctivae are normal. Right eye exhibits no discharge. Left eye exhibits no discharge.  Neck: Normal range of motion. Neck supple. No tracheal deviation present.  Cardiovascular: Normal rate, regular rhythm and normal pulses.  Pulmonary/Chest: Effort normal and breath sounds normal.  Abdominal: Soft. He exhibits no distension. There is no tenderness. There is no guarding.  Musculoskeletal: He exhibits no edema.  Neurological: He is alert and oriented to person, place, and time.  Skin: Skin is warm. No rash noted.  Psychiatric: His mood appears anxious.  Nursing note and vitals reviewed.    ED Treatments / Results  Labs (all labs ordered are listed, but only abnormal results are displayed) Labs Reviewed  CBC WITH DIFFERENTIAL/PLATELET - Abnormal; Notable for the following components:  Result Value   MCHC 36.1 (*)    All other components within normal limits  BASIC METABOLIC PANEL - Abnormal; Notable for the following components:   Glucose, Bld 100 (*)    All other components within normal limits  TROPONIN I    EKG EKG Interpretation  Date/Time:  Friday Jul 03 2017 12:27:19 EDT Ventricular Rate:  76 PR Interval:    QRS Duration: 96 QT Interval:  354 QTC Calculation: 398 R Axis:   63 Text Interpretation:  Sinus rhythm Abnormal  inferior Q waves Confirmed by Blane Ohara [phone]) on 07/03/2017 12:53:07 PM   Radiology No results found.  Procedures Procedures (including critical care time)  Medications Ordered in ED Medications - No data to display   Initial Impression / Assessment and Plan / ED Course  I have reviewed the triage vital signs and the nursing notes.  Pertinent labs & imaging results that were available during my care of the patient were reviewed by me and considered in my medical decision making (see chart for details).    Patient presents with clinically anxiety difficulties. Patient is low risk cardiac, no blood clot risk factors. PERC negative. Patient had troponin which is negative and EKG without signs of ischemia. Patient's well-appearing on assessment and discussed close outpatient follow-up for further evaluation by primary doctor.  Results and differential diagnosis were discussed with the patient/parent/guardian. Xrays were independently reviewed by myself.  Close follow up outpatient was discussed, comfortable with the plan.   Medications - No data to display  Vitals:   07/03/17 1222 07/03/17 1223 07/03/17 1320  BP: 126/89  124/86  Pulse: 76  72  Resp: 18  14  Temp: 98.4 F (36.9 C)    TempSrc: Oral    SpO2: 100%  96%  Weight:  83 kg (183 lb)   Height:   (1.753 m)     Final diagnoses:  Atypical chest pain     Final Clinical Impressions(s) / ED Diagnoses   Final diagnoses:  Atypical chest pain    ED Discharge Orders    None       Blane Ohara, MD 07/03/17 1414

## 2017-07-03 NOTE — ED Triage Notes (Signed)
Pt c/o chest tightness for several hours. States his watch has had his heart rate reading over 100 for the past few days. Hx of anxiety and states he has had several panic attacks this week.

## 2017-07-03 NOTE — ED Notes (Signed)
ED Provider at bedside. 

## 2017-07-03 NOTE — Telephone Encounter (Signed)
Pt called stating his heart rate was 207 yesterday, and today it is > 120; he also states that he is not sure if this is due to anxiety; the day before it was 140-160; the pt states that he has had multiple panic attacks with the last one being 07/02/17 at 2000; he states that he had to put a pet down and is having other issues in his life; he also says he is have "complete mental break downs" at times with episodes of crying; however he denies wanting to harm himself or others; recommendations made per nurse triage to include going to ED; the pt verifies understanding and will proceed to ED; will route to office for notification of this encounter.  Reason for Disposition . Patient sounds very sick or weak to the triager  Answer Assessment - Initial Assessment Questions 1. DESCRIPTION: "Please describe your heart rate or heart beat that you are having" (e.g., fast/slow, regular/irregular, skipped or extra beats, "palpitations")     fast 2. ONSET: "When did it start?" (Minutes, hours or days)      days 3. DURATION: "How long does it last" (e.g., seconds, minutes, hours)     days 4. PATTERN "Does it come and go, or has it been constant since it started?"  "Does it get worse with exertion?"   "Are you feeling it now?"     Comes and goes 5. TAP: "Using your hand, can you tap out what you are feeling on a chair or table in front of you, so that I can hear?" (Note: not all patients can do this)    n/a 6. HEART RATE: "Can you tell me your heart rate?" "How many beats in 15 seconds?"  (Note: not all patients can do this)      n/a 7. RECURRENT SYMPTOM: "Have you ever had this before?" If so, ask: "When was the last time?" and "What happened that time?"      Yes; used to happen once and a while  8. CAUSE: "What do you think is causing the palpitations?"     Maybe anxiety 9. CARDIAC HISTORY: "Do you have any history of heart disease?" (e.g., heart attack, angina, bypass surgery, angioplasty, arrhythmia)      Family history of high blood pressure, heart attack and stroke 10. OTHER SYMPTOMS: "Do you have any other symptoms?" (e.g., dizziness, chest pain, sweating, difficulty breathing)       Anxiety with dizziness, chest tightness at times  11. PREGNANCY: "Is there any chance you are pregnant?" "When was your last menstrual period?"       n/a  Protocols used: HEART RATE AND HEARTBEAT QUESTIONS-A-AH

## 2017-07-03 NOTE — ED Notes (Signed)
DC note: pt teaching done re: importance of MD follow up and discussing medication to assist with controlling anxiety attacks. Opportunity for questions provided, Teach Back Method used

## 2017-07-03 NOTE — ED Notes (Signed)
Pt on monitor 

## 2017-07-06 ENCOUNTER — Ambulatory Visit (INDEPENDENT_AMBULATORY_CARE_PROVIDER_SITE_OTHER): Payer: Self-pay | Admitting: Medical

## 2017-07-06 ENCOUNTER — Encounter: Payer: Self-pay | Admitting: Medical

## 2017-07-06 VITALS — BP 102/72 | HR 72 | Temp 98.2°F | Ht 69.5 in | Wt 185.0 lb

## 2017-07-06 DIAGNOSIS — K219 Gastro-esophageal reflux disease without esophagitis: Secondary | ICD-10-CM

## 2017-07-06 DIAGNOSIS — R5383 Other fatigue: Secondary | ICD-10-CM

## 2017-07-06 DIAGNOSIS — M2550 Pain in unspecified joint: Secondary | ICD-10-CM

## 2017-07-06 DIAGNOSIS — F32A Depression, unspecified: Secondary | ICD-10-CM

## 2017-07-06 DIAGNOSIS — F329 Major depressive disorder, single episode, unspecified: Secondary | ICD-10-CM

## 2017-07-06 DIAGNOSIS — R059 Cough, unspecified: Secondary | ICD-10-CM

## 2017-07-06 DIAGNOSIS — F419 Anxiety disorder, unspecified: Secondary | ICD-10-CM

## 2017-07-06 DIAGNOSIS — R05 Cough: Secondary | ICD-10-CM

## 2017-07-06 MED ORDER — BUSPIRONE HCL 15 MG PO TABS: 15.0000 mg | ORAL_TABLET | Freq: Two times a day (BID) | ORAL | 0 refills | Status: DC

## 2017-07-06 MED ORDER — RANITIDINE HCL 150 MG PO CAPS: 150.0000 mg | ORAL_CAPSULE | Freq: Two times a day (BID) | ORAL | 0 refills | Status: DC

## 2017-07-06 MED ORDER — VENLAFAXINE HCL ER 37.5 MG PO CP24: 37.5000 mg | ORAL_CAPSULE | Freq: Every day | ORAL | 0 refills | Status: DC

## 2017-07-06 MED FILL — busPIRone HCL 15 MG TABS: 15 | 30 days supply | Qty: 60 | Fill #0

## 2017-07-06 MED FILL — raNITIdine HCL 150 MG TABS: 150 | 30 days supply | Qty: 60 | Fill #0

## 2017-07-06 MED FILL — VENLAFAXINE HCL ER 37.5 MG: 37.5 | 30 days supply | Qty: 30 | Fill #0

## 2017-07-06 NOTE — Progress Notes (Signed)
Subjective:    Patient ID: Fred Santos, male    DOB: [birth year], 27 y.o.   MRN: [id number]  HPI  Pt in for cough that occurs intermittently that has occurred since mid April.  Pt saw Dr. Carmelia Roller and trial use of albuterol did not help much at all. He notified pcp did not work.  Dr. Carmelia Roller recommended trying pepcid. Hx of reflux and takes tums and rolaids for years. He describes heavy use. He states he will bring up clear mucus/dry heaves. Worse when he sleeps.  Pt states also severe anxiety. He has 4 panic attacks in last week. He uses trazadone to help him sleep. He does feel some depression for past few weeks. He is not sure what is making him depression. Depression and anxiety have been going on for years. Pt has never been on ssri.   Pt wakes up/states has night terrors    Review of Systems  Constitutional: Positive for fatigue. Negative for chills and fever.  HENT: Negative for congestion, ear discharge and ear pain.   Respiratory: Negative for shortness of breath and wheezing.   Cardiovascular: Negative for chest pain and palpitations.  Gastrointestinal: Negative for abdominal pain, blood in stool, constipation, diarrhea, nausea and vomiting.  Musculoskeletal: Negative for back pain and neck stiffness.       Knee pains.  Skin: Negative for rash.  Neurological: Positive for dizziness. Negative for seizures, syncope, facial asymmetry, weakness and light-headedness.       Some transient light headed and week. Describing some fatigue. Also reporting some pains in knees.   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 This was in doors.   Hematological: Negative for adenopathy. Does not bruise/bleed easily.  Psychiatric/Behavioral: Positive for dysphoric mood and sleep disturbance. Negative for behavioral problems, confusion and suicidal ideas. The patient is nervous/anxious.    Past Medical History:  Diagnosis Date  . Allergy   . Anxiety   . Insomnia      Social  History   Socioeconomic History  . Marital status: Single    Spouse name: Not on file  . Number of children: Not on file  . Years of education: Not on file  . Highest education level: Not on file  Occupational History  . Not on file  Social Needs  . Financial resource strain: Not on file  . Food insecurity:    Worry: Not on file    Inability: Not on file  . Transportation needs:    Medical: Not on file    Non-medical: Not on file  Tobacco Use  . Smoking status: Former Games developer  . Smokeless tobacco: Never Used  Substance and Sexual Activity  . Alcohol use: Yes    Comment: denies current use  . Drug use: No  . Sexual activity: Yes    Partners: Female  Lifestyle  . Physical activity:    Days per week: Not on file    Minutes per session: Not on file  . Stress: Not on file  Relationships  . Social connections:    Talks on phone: Not on file    Gets together: Not on file    Attends religious service: Not on file    Active member of club or organization: Not on file    Attends meetings of clubs or organizations: Not on file    Relationship status: Not on file  . Intimate partner violence:    Fear of current or ex partner: Not on file  Emotionally abused: Not on file    Physically abused: Not on file    Forced sexual activity: Not on file  Other Topics Concern  . Not on file  Social History Narrative  . Not on file    Past Surgical History:  Procedure Laterality Date  . APPENDECTOMY      No family history on file.  No Known Allergies  Current Outpatient Medications on File Prior to Visit  Medication Sig Dispense Refill  . albuterol (PROVENTIL HFA;VENTOLIN HFA) 108 (90 Base) MCG/ACT inhaler Inhale 2 puffs into the lungs every 6 (six) hours as needed for wheezing or shortness of breath. 1 Inhaler 0  . traZODone (DESYREL) 50 MG tablet Take 0.5-1 tablets (25-50 mg total) by mouth at bedtime as needed for sleep. 30 tablet 3   No current facility-administered  medications on file prior to visit.     BP 102/72 (BP Location: Left Arm, Patient Position: Sitting, Cuff Size: Normal)   Pulse 72   Temp 98.2 F (36.8 C) (Oral)   Ht 5' 9.5" (1.765 m)   Wt 185 lb (83.9 kg)   SpO2 96%   BMI 26.93 kg/m      Objective:   Physical Exam  General Mental Status- Alert. General Appearance- Not in acute distress.   Skin General: Color- Normal Color. Moisture- Normal Moisture.  Neck Carotid Arteries- Normal color. Moisture- Normal Moisture. No carotid bruits. No JVD.  Chest and Lung Exam Auscultation: Breath Sounds:-Normal.  Cardiovascular Auscultation:Rythm- Regular. Murmurs & Other Heart Sounds:Auscultation of the heart reveals- No Murmurs.  Abdomen Inspection:-Inspeection Normal. Palpation/Percussion:Note:No mass. Palpation and Percussion of the abdomen reveal- Non Tender, Non Distended + BS, no rebound or guarding.    Neurologic Cranial Nerve exam:- CN III-XII intact(No nystagmus), symmetric smile. Strength:- 5/5 equal and symmetric strength both upper and lower extremities.     Assessment & Plan:  Genella Rife may be your primary cause of cough. Rx ranitidine and eat healthier diet. Avoid fried foods.  For depression and anxiety rx effexor and buspar.(Rx advisement given). If any severe mood changes, thoughts of harm to self or others then [facility])  For cough get cxr since greater than 3 weeks and cough persists.  For fatigue and joint pains please get labs.  Follow up in one month or as needed.  Esperanza Richters, PA-C

## 2017-07-06 NOTE — Patient Instructions (Addendum)
Genella Rife may be your primary cause of cough. Rx ranitidine and eat healthier diet. Avoid fried foods.  For depression and anxiety rx effexor and buspar.(Rx advisement given). If any severe mood changes, thoughts of harm to self or others then [facility])  For cough get cxr since greater than 3 weeks and cough persists.  For fatigue and joint pains please get labs.  Follow up in one month or as needed

## 2017-07-20 ENCOUNTER — Ambulatory Visit: Payer: Self-pay | Admitting: Family Medicine

## 2017-08-06 ENCOUNTER — Ambulatory Visit: Payer: Medicaid Other | Admitting: Family Medicine

## 2017-08-06 ENCOUNTER — Encounter: Payer: Self-pay | Admitting: Family Medicine

## 2017-08-06 VITALS — BP 108/64 | HR 84 | Temp 98.6°F | Ht 70.0 in | Wt 183.1 lb

## 2017-08-06 DIAGNOSIS — F411 Generalized anxiety disorder: Secondary | ICD-10-CM

## 2017-08-06 DIAGNOSIS — K219 Gastro-esophageal reflux disease without esophagitis: Secondary | ICD-10-CM | POA: Diagnosis not present

## 2017-08-06 MED ORDER — BUSPIRONE HCL 15 MG PO TABS: 15.0000 mg | ORAL_TABLET | Freq: Two times a day (BID) | ORAL | 5 refills | Status: DC

## 2017-08-06 MED ORDER — VENLAFAXINE HCL ER 75 MG PO CP24: 75.0000 mg | ORAL_CAPSULE | Freq: Every day | ORAL | 1 refills | Status: DC

## 2017-08-06 MED ORDER — DOXEPIN HCL 10 MG PO CAPS: 10.0000 mg | ORAL_CAPSULE | Freq: Every evening | ORAL | 1 refills | Status: DC | PRN

## 2017-08-06 MED ORDER — RANITIDINE HCL 150 MG PO CAPS: 150.0000 mg | ORAL_CAPSULE | Freq: Two times a day (BID) | ORAL | 5 refills | Status: DC

## 2017-08-06 MED FILL — VENLAFAXINE HCL ER 75 MG CA: 75 | 30 days supply | Qty: 30 | Fill #0

## 2017-08-06 MED FILL — raNITIdine HCL 150 MG TABS: 150 | 30 days supply | Qty: 60 | Fill #0

## 2017-08-06 MED FILL — DOXEPIN HCL 10 MG CAPS: 10 | 30 days supply | Qty: 30 | Fill #0

## 2017-08-06 MED FILL — busPIRone HCL 15 MG TABS: 15 | 30 days supply | Qty: 60 | Fill #0

## 2017-08-06 NOTE — Patient Instructions (Addendum)
The only lifestyle changes that have data behind them are weight loss for the overweight/obese and elevating the head of the bed. Finding out which foods/positions are triggers is important.  Please consider counseling. Contact [phone] to schedule an appointment or inquire about cost/insurance coverage.  Coping skills Choose 5 that work for you:  Take a deep breath  Count to 20  Read a book  Do a puzzle  Meditate  Bake  Sing  Knit  Garden  Pray  Go outside  Call a friend  Listen to music  Take a walk  Color  Send a note  Take a bath  Watch a movie  Be alone in a quiet place  Pet an animal  Visit a friend  Journal  Exercise  Stretch    If your reflux does not improve, let us know. We will try another medicine. If you every cough up blood again, let us know.   Let us know if you need anything.

## 2017-08-06 NOTE — Progress Notes (Signed)
Chief Complaint  Patient presents with  . Follow-up    medication    GERD [place] SirenJake Fred Santos is a 27 y.o. male who presents for evaluation of heartburn. Cough, burning, belching. Aggravating factors and specific triggers include spicy foods. Alleviating factors include H2 blockers Feels better on Zantac. Had worsened over past couple days, no known trigger. Sleeps in recliner.  F/u for anxiety/insomnia. Did not do well on SSRI, changed to Effexor and is doing well. Trazodone not particularly helpful.  Past Medical History:  Diagnosis Date  . Allergy   . Anxiety   . Insomnia    History reviewed. No pertinent family history.  Allergies as of 08/06/2017   No Known Allergies     Medication List        Accurate as of 08/06/17  1:18 PM. Always use your most recent med list.          busPIRone 15 MG tablet Commonly known as:  BUSPAR Take 1 tablet (15 mg total) by mouth 2 (two) times daily.   doxepin 10 MG capsule Commonly known as:  SINEQUAN Take 1 capsule (10 mg total) by mouth at bedtime as needed.   ranitidine 150 MG capsule Commonly known as:  ZANTAC Take 1 capsule (150 mg total) by mouth 2 (two) times daily.   venlafaxine XR 75 MG 24 hr capsule Commonly known as:  EFFEXOR XR Take 1 capsule (75 mg total) by mouth daily with breakfast.      Review of Systems Psych: As noted in HPI Cardiovascular: no exercise intolerance, no chest pain Gastrointestinal: as noted in HPI Hematologic/Lymphatic:  no night sweats, no swollen nodes  Exam BP 108/64 (BP Location: Left Arm, Patient Position: Sitting, Cuff Size: Normal)   Pulse 84   Temp 98.6 F (37 C) (Oral)   Ht 5\' 10"  (1.778 m)   Wt 183 lb 2 oz (83.1 kg)   SpO2 96%   BMI 26.28 kg/m  General:  well developed, well nourished, in no apparent distress Skin:  warm, no pallor or diaphoresis Thorax:  nontender Lungs:  clear to auscultation, breath sounds equal bilaterally, no respiratory distress, no wheezes Cardio:   regular rate and rhythm without murmurs, heart sounds without clicks or rubs Abdomen:  abdomen soft, nontender; bowel sounds normal; no masses or organomegaly Psych: Well oriented with normal range of affect appropriate judgment/insight  Assessment and Plan  Gastroesophageal reflux disease, esophagitis presence not specified  GAD (generalized anxiety disorder)  GI cocktail. Cont H2, if no improvement, will let us know and we will call in PPI. Precautions discussed. Increase dose of Effexor. Cont BuSpar. Add doxepin, pt not interested in addictive options. Counseling info given. Coping mechs written down. F/u in 6 weeks. Pt voiced understanding and agreement to the plan.   Jilda Rocheicholas Paul [place] Settlement[place], DO 08/06/17  1:18 PM

## 2017-09-06 ENCOUNTER — Ambulatory Visit (HOSPITAL_COMMUNITY)
Admission: EM | Admit: 2017-09-06 | Discharge: 2017-09-06 | Disposition: A | Discharge status: Home / Self Care | Payer: Medicaid Other | Attending: Family Medicine | Admitting: Family Medicine

## 2017-09-06 ENCOUNTER — Ambulatory Visit (INDEPENDENT_AMBULATORY_CARE_PROVIDER_SITE_OTHER): Payer: Medicaid Other

## 2017-09-06 ENCOUNTER — Encounter (HOSPITAL_COMMUNITY): Payer: Self-pay

## 2017-09-06 DIAGNOSIS — W19XXXA Unspecified fall, initial encounter: Secondary | ICD-10-CM | POA: Diagnosis not present

## 2017-09-06 DIAGNOSIS — S63502A Unspecified sprain of left wrist, initial encounter: Secondary | ICD-10-CM | POA: Diagnosis not present

## 2017-09-06 DIAGNOSIS — S6392XA Sprain of unspecified part of left wrist and hand, initial encounter: Secondary | ICD-10-CM | POA: Diagnosis not present

## 2017-09-06 NOTE — ED Triage Notes (Signed)
Pt presents with complaints of left wrist pain after falling down on his wrist today. No swelling and rom is intact.

## 2017-09-06 NOTE — ED Provider Notes (Signed)
[facility]    CSN: [id number] Arrival date & time: 09/06/17  1742     History   Chief Complaint Chief Complaint  Patient presents with  . Wrist Pain    HPI Fred Santos is a 27 y.o. male.   HPI  Patient is here for a left wrist injury.  He is left-handed.  He states that he was hurrying to get out of bed this morning because he heard the baby cry, and he stumbled.  He fell on his flexed wrist.  He has good range of motion.  Minor swelling.  Is tender over the ulnar styloid.  He is here with his mother-in-law who is a Engineer, civil (consulting)nurse.  She feels he needs an x-ray. He is otherwise in good health.  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  Past Medical History:  Diagnosis Date  . Allergy   . Anxiety   . Insomnia     Patient Active Problem List   Diagnosis Date Noted  . GAD (generalized anxiety disorder) 08/06/2017  . Gastroesophageal reflux disease 06/17/2017  . Sleep disorder 06/17/2017    Past Surgical History:  Procedure Laterality Date  . APPENDECTOMY         Home Medications    Prior to Admission medications   Medication Sig Start Date End Date Taking? Authorizing Provider  busPIRone (BUSPAR) 15 MG tablet Take 1 tablet (15 mg total) by mouth 2 (two) times daily. 08/06/17   Wendling, Jilda RocheNicholas Paul, DO  doxepin (SINEQUAN) 10 MG capsule Take 1 capsule (10 mg total) by mouth at bedtime as needed. 08/06/17   Sharlene DoryWendling, Nicholas Paul, DO  ranitidine (ZANTAC) 150 MG capsule Take 1 capsule (150 mg total) by mouth 2 (two) times daily. 08/06/17   Sharlene DoryWendling, Nicholas Paul, DO  venlafaxine XR (EFFEXOR XR) 75 MG 24 hr capsule Take 1 capsule (75 mg total) by mouth daily with breakfast. 08/06/17   Sharlene DoryWendling, Nicholas Paul, DO    Family History No family history on file.  Social History Social History   Tobacco Use  . Smoking status: Former Games developermoker  . Smokeless tobacco: Never Used  Substance Use Topics  . Alcohol use: Yes    Comment: denies current use  . Drug use: No      Allergies   Patient has no known allergies.   Review of Systems Review of Systems  Constitutional: Negative for chills and fever.  HENT: Negative for ear pain and sore throat.   Eyes: Negative for pain and visual disturbance.  Respiratory: Negative for cough and shortness of breath.   Cardiovascular: Negative for chest pain and palpitations.  Gastrointestinal: Negative for abdominal pain and vomiting.  Genitourinary: Negative for dysuria and hematuria.  Musculoskeletal: Positive for arthralgias. Negative for back pain.  Skin: Negative for color change and rash.  Neurological: Negative for seizures and syncope.  Psychiatric/Behavioral: Negative for decreased concentration and dysphoric mood.  All other systems reviewed and are negative.    Physical Exam Triage Vital Signs ED Triage Vitals  Enc Vitals Group     BP 09/06/17 1819 (!) 135/91     Pulse Rate 09/06/17 1819 77     Resp 09/06/17 1819 18     Temp 09/06/17 1819 98.9 F (37.2 C)     Temp src --      SpO2 09/06/17 1819 99 %     Weight --      Height --      Head Circumference --  Peak Flow --      Pain Score 09/06/17 1820 3     Pain Loc --      Pain Edu? --      Excl. in GC? --    No data found.  Updated Vital Signs BP (!) 135/91   Pulse 77   Temp 98.9 F (37.2 C)   Resp 18   SpO2 99%   Visual Acuity Right Eye Distance:   Left Eye Distance:   Bilateral Distance:    Right Eye Near:   Left Eye Near:    Bilateral Near:     Physical Exam  Constitutional: He appears well-developed and well-nourished. No distress.  HENT:  Head: Normocephalic and atraumatic.  Mouth/Throat: Oropharynx is clear and moist.  Eyes: Pupils are equal, round, and reactive to light. Conjunctivae are normal.  Neck: Normal range of motion.  Cardiovascular: Normal rate.  Pulmonary/Chest: Effort normal. No respiratory distress.  Abdominal: Soft. He exhibits no distension.  Musculoskeletal: Normal range of motion. He  exhibits no edema.  Right upper extremity normal.  Left upper extremity appears normal.  Visually there is mild swelling over the carpal region dorsally.  Tenderness is limited to the distal ulna, ulnar styloid.  Grip strength is good.  Cap refill is normal.  Sensation is normal.  No tenderness over the carpal bones or scaphoid  Neurological: He is alert.  Skin: Skin is warm and dry.     UC Treatments / Results  Labs (all labs ordered are listed, but only abnormal results are displayed) Labs Reviewed - No data to display  EKG None  Radiology Dg Wrist Complete Left  Result Date: 09/06/2017 CLINICAL DATA:  Larey Seat this morning onto outstretched hand. Ulnar wrist pain. Initial encounter. EXAM: LEFT WRIST - COMPLETE 3+ VIEW COMPARISON:  None. FINDINGS: There is no evidence of fracture or dislocation. There is no evidence of arthropathy or other focal bone abnormality. Soft tissues are unremarkable. IMPRESSION: Negative. Electronically Signed   By: Myles Rosenthal M.D.   On: 09/06/2017 19:15    Procedures Procedures (including critical care time)  Medications Ordered in UC Medications - No data to display  Initial Impression / Assessment and Plan / UC Course  I have reviewed the triage vital signs and the nursing notes.  Pertinent labs & imaging results that were available during my care of the patient were reviewed by me and considered in my medical decision making (see chart for details).      Final Clinical Impressions(s) / UC Diagnoses   Final diagnoses:  Sprain of left wrist, initial encounter     Discharge Instructions     Use ice to reduce pain and swelling.  20 minutes every couple of hours. May take ibuprofen 800 mg 3 times a day with food as needed for pain Wear brace for comfort.  Expect resolution in 2 to 3 weeks.  Follow-up with PCP    ED Prescriptions    None     Controlled Substance Prescriptions [place] Controlled Substance Registry consulted? Not Applicable     Eustace Moore, MD 09/06/17 1919

## 2017-09-06 NOTE — Discharge Instructions (Addendum)
Use ice to reduce pain and swelling.  20 minutes every couple of hours. May take ibuprofen 800 mg 3 times a day with food as needed for pain Wear brace for comfort.  Expect resolution in 2 to 3 weeks.  Follow-up with PCP

## 2017-09-07 MED FILL — VENLAFAXINE HCL ER 75 MG CA: 75 | 30 days supply | Qty: 30 | Fill #1

## 2017-09-07 MED FILL — busPIRone HCL 15 MG TABS: 15 | 30 days supply | Qty: 60 | Fill #1

## 2017-09-07 MED FILL — raNITIdine HCL 150 MG TABS: 150 | 30 days supply | Qty: 60 | Fill #1

## 2017-09-17 ENCOUNTER — Encounter: Payer: Self-pay | Admitting: Family Medicine

## 2017-09-17 ENCOUNTER — Ambulatory Visit (INDEPENDENT_AMBULATORY_CARE_PROVIDER_SITE_OTHER): Payer: Medicaid Other | Admitting: Family Medicine

## 2017-09-17 VITALS — BP 112/82 | HR 79 | Temp 98.1°F | Ht 69.0 in | Wt 190.4 lb

## 2017-09-17 DIAGNOSIS — S63502A Unspecified sprain of left wrist, initial encounter: Secondary | ICD-10-CM

## 2017-09-17 DIAGNOSIS — K219 Gastro-esophageal reflux disease without esophagitis: Secondary | ICD-10-CM | POA: Diagnosis not present

## 2017-09-17 DIAGNOSIS — F411 Generalized anxiety disorder: Secondary | ICD-10-CM | POA: Diagnosis not present

## 2017-09-17 DIAGNOSIS — M25561 Pain in right knee: Secondary | ICD-10-CM

## 2017-09-17 MED ORDER — AMITRIPTYLINE HCL 25 MG PO TABS
25.0000 mg | ORAL_TABLET | Freq: Every day | ORAL | 2 refills | Status: DC
Start: 2017-09-17 — End: 2018-10-20

## 2017-09-17 MED ORDER — OMEPRAZOLE 20 MG PO CPDR: 20.0000 mg | DELAYED_RELEASE_CAPSULE | Freq: Every day | ORAL | 3 refills | Status: DC

## 2017-09-17 MED ORDER — PREDNISONE 20 MG PO TABS: 40.0000 mg | ORAL_TABLET | Freq: Every day | ORAL | 0 refills | Status: AC

## 2017-09-17 MED FILL — predniSONE 20 MG TABS: 20 | 5 days supply | Qty: 10 | Fill #0

## 2017-09-17 MED FILL — AMITRIPTYLINE HCL 25 MG TAB: 25 | 30 days supply | Qty: 30 | Fill #0

## 2017-09-17 MED FILL — OMEPRAZOLE 20 MG CPDR: 20 | 30 days supply | Qty: 30 | Fill #0

## 2017-09-17 NOTE — Patient Instructions (Signed)
Ice/cold pack over area for 10-15 min twice daily.  The only lifestyle changes that have data behind them are weight loss for the overweight/obese and elevating the head of the bed. Finding out which foods/positions are triggers is important.  Please consider counseling. Contact [phone] to schedule an appointment or inquire about cost/insurance coverage.  Wrist and Forearm Exercises Do exercises exactly as told by your health care provider and adjust them as directed. It is normal to feel mild stretching, pulling, tightness, or discomfort as you do these exercises, but you should stop right away if you feel sudden pain or your pain gets worse.   RANGE OF MOTION EXERCISES These exercises warm up your muscles and joints and improve the movement and flexibility of your injured wrist and forearm. These exercises also help to relieve pain, numbness, and tingling. These exercises are done using the muscles in your injured wrist and forearm. Exercise A: Wrist Flexion, Active 1. With your fingers relaxed, bend your wrist forward as far as you can. 2. Hold this position for 30 seconds. Repeat 2 times. Complete this exercise 3 times per week. Exercise B: Wrist Extension, Active 1. With your fingers relaxed, bend your wrist backward as far as you can. 2. Hold this position for 30 seconds. Repeat 2 times. Complete this exercise 3 times per week. Exercise C: Supination, Active  1. Stand or sit with your arms at your sides. 2. Bend your left / right elbow to an "L" shape (90 degrees). 3. Turn your palm upward until you feel a gentle stretch on the inside of your forearm. 4. Hold this position for 30 seconds. 5. Slowly return your palm to the starting position. Repeat 2 times. Complete this exercise 3 times per week. Exercise D: Pronation, Active  1. Stand or sit with your arms at your sides. 2. Bend your left / right elbow to an "L" shape (90 degrees). 3. Turn your palm downward until you feel  a gentle stretch on the top of your forearm. 4. Hold this position for 30 seconds. 5. Slowly return your palm to the starting position. Repeat 2 times. Complete this exercise once a day.  STRETCHING EXERCISES These exercises warm up your muscles and joints and improve the movement and flexibility of your injured wrist and forearm. These exercises also help to relieve pain, numbness, and tingling. These exercises are done using your healthy wrist and forearm to help stretch the muscles in your injured wrist and forearm. Exercise E: Wrist Flexion, Passive  1. Extend your left / right arm in front of you, relax your wrist, and point your fingers downward. 2. Gently push on the back of your hand. Stop when you feel a gentle stretch on the top of your forearm. 3. Hold this position for 30 seconds. Repeat 2 times. Complete this exercise 3 times per week. Exercise F: Wrist Extension, Passive  1. Extend your left / right arm in front of you and turn your palm upward. 2. Gently pull your palm and fingertips back so your fingers point downward. You should feel a gentle stretch on the palm-side of your forearm. 3. Hold this position for 30 seconds. Repeat 2 times. Complete this exercise 3 times per week. Exercise G: Forearm Rotation, Supination, Passive 1. Sit with your left / right elbow bent to an "L" shape (90 degrees) with your forearm resting on a table. 2. Keeping your upper body and shoulder still, use your other hand to rotate your forearm palm-up until you feel  a gentle to moderate stretch. 3. Hold this position for 30 seconds. 4. Slowly release the stretch and return to the starting position. Repeat 2 times. Complete this exercise 3 times per week. Exercise H: Forearm Rotation, Pronation, Passive 1. Sit with your left / right elbow bent to an "L" shape (90 degrees) with your forearm resting on a table. 2. Keeping your upper body and shoulder still, use your other hand to rotate your forearm  palm-down until you feel a gentle to moderate stretch. 3. Hold this position for 30 seconds. 4. Slowly release the stretch and return to the starting position. Repeat 2 times. Complete this exercise 3 times per week.  STRENGTHENING EXERCISES These exercises build strength and endurance in your wrist and forearm. Endurance is the ability to use your muscles for a long time, even after they get tired. Exercise I: Wrist Flexors  1. Sit with your left / right forearm supported on a table and your hand resting palm-up over the edge of the table. Your elbow should be bent to an "L" shape (about 90 degrees) and be below the level of your shoulder. 2. Hold a 3-5 lb weight in your left / right hand. Or, hold a rubber exercise band or tube in both hands, keeping your hands at the same level and hip distance apart. There should be a slight tension in the exercise band or tube. 3. Slowly curl your hand up toward your forearm. 4. Hold this position for 3 seconds. 5. Slowly lower your hand back to the starting position. Repeat 2 times. Complete this exercise 3 times per week. Exercise J: Wrist Extensors  1. Sit with your left / right forearm supported on a table and your hand resting palm-down over the edge of the table. Your elbow should be bent to an "L" shape (about 90 degrees) and be below the level of your shoulder. 2. Hold a 3-5 lb weight in your left / right hand. Or, hold a rubber exercise band or tube in both hands, keeping your hands at the same level and hip distance apart. There should be a slight tension in the exercise band or tube. 3. Slowly curl your hand up toward your forearm. 4. Hold this position for 3 seconds. 5. Slowly lower your hand back to the starting position. Repeat 2 times. Complete this exercise 3 times per week. Exercise K: Forearm Rotation, Supination  1. Sit with your left / right forearm supported on a table and your hand resting palm-down. Your elbow should be at your  side, bent to an "L" shape (about 90 degrees), and below the level of your shoulder. Keep your wrist stable and in a neutral position throughout the exercise. 2. Gently hold a lightweight hammer with your left / right hand. 3. Without moving your elbow or wrist, slowly rotate your palm upward to a thumbs-up position. 4. Hold this position for 3 seconds. 5. Slowly return your forearm to the starting position. Repeat 2 times. Complete this exercise 3 times per week. Exercise L: Forearm Rotation, Pronation  1. Sit with your left / right forearm supported on a table and your hand resting palm-up. Your elbow should be at your side, bent to an "L" shape (about 90 degrees), and below the level of your shoulder. Keep your wrist stable. Do not allow it to move backward or forward during the exercise. 2. Gently hold a lightweight hammer with your left / right hand. 3. Without moving your elbow or wrist, slowly rotate  your palm and hand upward to a thumbs-up position. 4. Hold this position for 3 seconds. 5. Slowly return your forearm to the starting position. Repeat 2 times. Complete this exercise 3 times per week. Exercise M: Grip Strengthening  1. Hold one of these items in your left / right hand: play dough, therapy putty, a dense sponge, a stress ball, or a large, rolled sock. 2. Squeeze as hard as you can without increasing pain. 3. Hold this position for 5 seconds. 4. Slowly release your grip. Repeat 2 times. Complete this exercise 3 times per week.  This information is not intended to replace advice given to you by your health care provider. Make sure you discuss any questions you have with your health care provider. Document Released: 12/18/2004 Document Revised: 10/29/2015 Document Reviewed: 10/29/2014 Elsevier Interactive Patient Education  [employer]2018 [employer]

## 2017-09-17 NOTE — Progress Notes (Signed)
Chief Complaint  Patient presents with  . Gastroesophageal Reflux  . Wrist Pain  . Knee Pain  . Insomnia    Subjective: Patient is a 27 y.o. male here for a myriad of issues.  Patient following up for anxiety.  We increased the dose of his Effexor but it caused him to have side effects.  Doxepin was not helpful.  BuSpar is questionable.  Sprained L wrist, getting better, still popping. Pain improving. Was not given stretches/exercises in UC.   R knee pain for 2-3 d, ice has not been helping. No inj or change in activity. Has been elevating. Stairs make it worse as well as bending it.  ROM is decreased.   GERD still bothersome. Zantac not helpful. GI cocktail resolved s/s's at last visit.    ROS: GI: +reflux Psych: +anxiety/irritable  Past Medical History:  Diagnosis Date  . Allergy   . Anxiety   . Insomnia     Objective: BP 112/82 (BP Location: Left Arm, Patient Position: Sitting, Cuff Size: Normal)   Pulse 79   Temp 98.1 F (36.7 C) (Oral)   Ht 5' 9"  (1.753 m)   Wt 190 lb 6 oz (86.4 kg)   SpO2 97%   BMI 28.11 kg/m  General: Awake, appears stated age HEENT: MMM, EOMi Heart: RRR, no murmurs Lungs: CTAB, no rales, wheezes or rhonchi. No accessory muscle use Abd: BS+, soft, NT, ND MSK: R knee-TTP over the patella anteriorly, no effusion, no joint line tenderness, negative varus/valgus, McMurray's, Lachman's, Stines. Psych: Age appropriate judgment and insight, normal affect and mood  Assessment and Plan: GAD (generalized anxiety disorder) - Plan: amitriptyline (ELAVIL) 25 MG tablet  Gastroesophageal reflux disease, esophagitis presence not specified - Plan: omeprazole (PRILOSEC) 20 MG capsule  Acute pain of right knee - Plan: predniSONE (DELTASONE) 20 MG tablet  Sprain of left wrist, initial encounter  Orders as above. Stretches and exercises given for the wrist. Follow-up in 6 weeks. The patient voiced understanding and agreement to the plan.  [place], DO 09/17/17  12:22 PM

## 2017-09-17 NOTE — Progress Notes (Signed)
Pre visit review using our clinic review tool, if applicable. No additional management support is needed unless otherwise documented below in the visit note. 

## 2017-10-02 ENCOUNTER — Emergency Department (HOSPITAL_BASED_OUTPATIENT_CLINIC_OR_DEPARTMENT_OTHER): Payer: Medicaid Other

## 2017-10-02 ENCOUNTER — Emergency Department (HOSPITAL_BASED_OUTPATIENT_CLINIC_OR_DEPARTMENT_OTHER)
Admission: EM | Admit: 2017-10-02 | Discharge: 2017-10-02 | Disposition: A | Discharge status: Home / Self Care | Payer: Medicaid Other | Attending: Emergency Medicine | Admitting: Emergency Medicine

## 2017-10-02 ENCOUNTER — Encounter (HOSPITAL_BASED_OUTPATIENT_CLINIC_OR_DEPARTMENT_OTHER): Payer: Self-pay

## 2017-10-02 ENCOUNTER — Other Ambulatory Visit: Payer: Self-pay

## 2017-10-02 DIAGNOSIS — Z87891 Personal history of nicotine dependence: Secondary | ICD-10-CM | POA: Diagnosis not present

## 2017-10-02 DIAGNOSIS — Z79899 Other long term (current) drug therapy: Secondary | ICD-10-CM | POA: Diagnosis not present

## 2017-10-02 DIAGNOSIS — M79641 Pain in right hand: Secondary | ICD-10-CM | POA: Diagnosis not present

## 2017-10-02 NOTE — ED Notes (Signed)
Pt not in room when attempting to assess x 2.

## 2017-10-02 NOTE — Discharge Instructions (Signed)
Your x-ray does not show any evidence of fracture today

## 2017-10-02 NOTE — ED Triage Notes (Signed)
Pt c/o hearing "popping and cracking" to right hand approx 630pm-states he as hit hand repeatedly with a sledge hammer while at work this week-NAD-steady gait

## 2017-10-02 NOTE — ED Notes (Signed)
Pt not in room when attempting to assess.

## 2017-10-02 NOTE — ED Provider Notes (Signed)
[facility] EMERGENCY DEPARTMENT Provider Note   CSN: [id number] Arrival date & time: 10/02/17  2021     History   Chief Complaint Chief Complaint  Patient presents with  . Hand Injury    HPI Fred Santos is a 27 y.o. male.  Fred Santos is a 27 y.o. Male with a history of anxiety and insomnia, presents to the emergency department for evaluation of right hand injury.  Patient reports he noticed some popping and cracking in the right hand around the area of the first and second finger around 6:30 PM.  He reports at work he uses a mallet frequently and has hit his hand in this area multiple times when he misses his target.  He notes a small amount of redness and swelling over this area but no obvious deformity.  He denies any numbness or tingling, pain is a constant throbbing ache.  Able to move the wrist without difficulty.  No lacerations or abrasions.  Patient has not taken anything for pain prior to arrival.     Past Medical History:  Diagnosis Date  . Allergy   . Anxiety   . Insomnia     Patient Active Problem List   Diagnosis Date Noted  . GAD (generalized anxiety disorder) 08/06/2017  . Gastroesophageal reflux disease 06/17/2017  . Sleep disorder 06/17/2017    Past Surgical History:  Procedure Laterality Date  . APPENDECTOMY          Home Medications    Prior to Admission medications   Medication Sig Start Date End Date Taking? Authorizing Provider  amitriptyline (ELAVIL) 25 MG tablet Take 1 tablet (25 mg total) by mouth at bedtime. 09/17/17   Wendling, Jilda RocheNicholas Paul, DO  busPIRone (BUSPAR) 15 MG tablet Take 1 tablet (15 mg total) by mouth 2 (two) times daily. 08/06/17   Sharlene DoryWendling, Nicholas Paul, DO  omeprazole (PRILOSEC) 20 MG capsule Take 1 capsule (20 mg total) by mouth daily. 09/17/17   Sharlene DoryWendling, Nicholas Paul, DO    Family History No family history on file.  Social History Social History   Tobacco Use  . Smoking status: Former Games developermoker  .  Smokeless tobacco: Never Used  Substance Use Topics  . Alcohol use: Yes    Comment: denies current use  . Drug use: No     Allergies   Patient has no known allergies.   Review of Systems Review of Systems  Constitutional: Negative for chills and fever.  Musculoskeletal: Positive for arthralgias and joint swelling.  Skin: Positive for color change. Negative for rash and wound.  Neurological: Negative for weakness and numbness.     Physical Exam Updated Vital Signs BP 129/87 (BP Location: Left Arm)   Pulse 89   Temp 98.6 F (37 C) (Oral)   Resp 18   Ht 5\' 9"  (1.753 m)   Wt 85.7 kg   SpO2 98%   BMI 27.90 kg/m   Physical Exam  Constitutional: He appears well-developed and well-nourished. No distress.  HENT:  Head: Normocephalic and atraumatic.  Eyes: Right eye exhibits no discharge. Left eye exhibits no discharge.  Pulmonary/Chest: Effort normal. No respiratory distress.  Musculoskeletal:       Right hand: He exhibits tenderness and swelling. He exhibits normal range of motion, normal capillary refill and no laceration. Normal sensation noted. Normal strength noted.       Hands: Tenderness to palpation with some redness and swelling noted over the dorsal surface of the right hand in between the first  and second fingers, no obvious palpable deformity.  Sensation intact throughout hand, 2+ radial pulse with good capillary refill.  5/5 strength and cardinal hand movements intact.  Neurological: He is alert. Coordination normal.  Skin: Skin is warm and dry. Capillary refill takes less than 2 seconds. He is not diaphoretic.  Psychiatric: He has a normal mood and affect. His behavior is normal.  Nursing note and vitals reviewed.    ED Treatments / Results  Labs (all labs ordered are listed, but only abnormal results are displayed) Labs Reviewed - No data to display  EKG None  Radiology Dg Hand Complete Right  Result Date: 10/02/2017 CLINICAL DATA:  Patient has hit  right hand with a sledgehammer multiple times this past week. Swelling and discoloration. EXAM: RIGHT HAND - COMPLETE 3+ VIEW COMPARISON:  None. FINDINGS: Degenerative changes in the interphalangeal joints of the right hand. No evidence of acute fracture or dislocation. Benign-appearing focal sclerosis in the midshaft second metacarpal bone. No expansile or destructive bone lesions appreciated. Soft tissues are unremarkable. IMPRESSION: Degenerative changes in the right hand. No acute bony abnormalities. Electronically Signed   By: Burman NievesWilliam  Stevens M.D.   On: 10/02/2017 21:09    Procedures Procedures (including critical care time)  Medications Ordered in ED Medications - No data to display   Initial Impression / Assessment and Plan / ED Course  I have reviewed the triage vital signs and the nursing notes.  Pertinent labs & imaging results that were available during my care of the patient were reviewed by me and considered in my medical decision making (see chart for details).  Patient presents for evaluation of right hand injury, there is tenderness and swelling in between the first and second fingers over the dorsal aspect.  Hand is neurovascularly intact.  X-ray shows no evidence of fracture, there are some mild degenerative changes noted.  Will place patient in thumb spica splint for support, encourage ibuprofen and Tylenol as needed for pain as well as ice and elevation.  Patient to follow-up with hand surgery if symptoms not improving.  Return precautions discussed.  Patient expresses understanding and is in agreement with this plan.  Final Clinical Impressions(s) / ED Diagnoses   Final diagnoses:  Right hand pain    ED Discharge Orders    None       Legrand RamsFord, Kelsey N, PA-C 10/03/17 0118    Shaune PollackIsaacs, Cameron, MD 10/04/17 [phone]

## 2017-10-23 ENCOUNTER — Ambulatory Visit: Payer: Medicaid Other | Admitting: Family Medicine

## 2018-05-29 ENCOUNTER — Emergency Department
Admission: EM | Admit: 2018-05-29 | Discharge: 2018-05-29 | Disposition: A | Discharge status: Home / Self Care | Payer: Medicaid Other | Attending: Emergency Medicine | Admitting: Emergency Medicine

## 2018-05-29 ENCOUNTER — Emergency Department: Payer: Medicaid Other

## 2018-05-29 ENCOUNTER — Other Ambulatory Visit: Payer: Self-pay

## 2018-05-29 DIAGNOSIS — Z79899 Other long term (current) drug therapy: Secondary | ICD-10-CM | POA: Insufficient documentation

## 2018-05-29 DIAGNOSIS — K219 Gastro-esophageal reflux disease without esophagitis: Secondary | ICD-10-CM | POA: Diagnosis not present

## 2018-05-29 DIAGNOSIS — Z87891 Personal history of nicotine dependence: Secondary | ICD-10-CM | POA: Insufficient documentation

## 2018-05-29 DIAGNOSIS — R070 Pain in throat: Secondary | ICD-10-CM | POA: Diagnosis present

## 2018-05-29 HISTORY — DX: Gastro-esophageal reflux disease without esophagitis: K21.9

## 2018-05-29 LAB — BASIC METABOLIC PANEL
Anion gap: 14 (ref 5–15)
BUN: 19 mg/dL (ref 6–20)
CO2: 27 mmol/L (ref 22–32)
Calcium: 10.8 mg/dL — ABNORMAL HIGH (ref 8.9–10.3)
Chloride: 100 mmol/L (ref 98–111)
Creatinine, Ser: 0.75 mg/dL (ref 0.61–1.24)
GFR calc Af Amer: >60 mL/min (ref >60)
GFR calc non Af Amer: >60 mL/min (ref >60)
Glucose, Bld: 94 mg/dL (ref 70–99)
Potassium: 3.8 mmol/L (ref 3.5–5.1)
Sodium: 141 mmol/L (ref 135–145)

## 2018-05-29 LAB — CBC
HCT: 46 % (ref 39.0–52.0)
Hemoglobin: 15.8 g/dL (ref 13.0–17.0)
MCH: 28.5 pg (ref 26.0–34.0)
MCHC: 34.3 g/dL (ref 30.0–36.0)
MCV: 82.9 fL (ref 80.0–100.0)
Platelets: 365 10*3/uL (ref 150–400)
RBC: 5.55 MIL/uL (ref 4.22–5.81)
RDW: 11.8 % (ref 11.5–15.5)
WBC: 9.9 10*3/uL (ref 4.0–10.5)
nRBC: 0 % (ref 0.0–0.2)

## 2018-05-29 LAB — TROPONIN I: Troponin I: <0.03 ng/mL (ref <0.03)

## 2018-05-29 MED ORDER — FAMOTIDINE 20 MG PO TABS
20.0000 mg | ORAL_TABLET | Freq: Once | ORAL | Status: AC
  Administered 2018-05-29: 20 mg via ORAL
  Filled 2018-05-29: qty 1

## 2018-05-29 MED ORDER — PANTOPRAZOLE SODIUM 40 MG PO TBEC
40.0000 mg | DELAYED_RELEASE_TABLET | Freq: Every day | ORAL | Status: DC
  Administered 2018-05-29: 21:00:00 40 mg via ORAL
  Filled 2018-05-29: qty 1

## 2018-05-29 MED ORDER — ALUM & MAG HYDROXIDE-SIMETH 200-200-20 MG/5ML PO SUSP
15.0000 mL | Freq: Once | ORAL | Status: AC
  Administered 2018-05-29: 21:00:00 15 mL via ORAL
  Filled 2018-05-29: qty 30

## 2018-05-29 NOTE — Discharge Instructions (Addendum)
Stop use of TUMS or ROLAIDS. STOP use of aleve.  START over the counter (as noted on box dosing) PEPCID and NEXIUM for next 2 weeks.  Please return to the emergency room right away if you are to develop a fever, severe nausea, your pain becomes severe or worsens, you are unable to keep food down, begin vomiting any dark or bloody fluid, you develop any dark or bloody stools, feel dehydrated, or other new concerns or symptoms arise.

## 2018-05-29 NOTE — ED Provider Notes (Signed)
[facility] Emergency Department Provider Note   ____________________________________________   None    (approximate)  I have reviewed the triage vital signs and the nursing notes.   HISTORY  Chief Complaint Gastroesophageal Reflux    HPI Fred Santos is a 28 y.o. male with a history of anxiety, acid reflux  Patient reports for the last 5 days been suffering from severe acid reflux.  He has it almost every day of his life for the last 5 days seems worse he not quite sure why.  He has been drinking about half a gallon of milk a day, using up to 90 times in the last week.  Reports he will help for a few minutes but then discomfort comes back within the next 20 minutes.  Is a burning in the back of his throat, worse than when he lays back or flat to the point when he lays down in bed it just feels like acid coming back up.  He has not had any vomiting.  Continue to move his bowels and has not had any black or bloody stools.  He does take Aleve 2 tablets each day.  No fevers or chills.  No nausea.  No chest pain.  Reports of burning discomfort mostly in the back of his throat.  No trouble breathing.   Past Medical History:  Diagnosis Date  . Allergy   . Anxiety   . GERD (gastroesophageal reflux disease)   . Insomnia     Patient Active Problem List   Diagnosis Date Noted  . GAD (generalized anxiety disorder) 08/06/2017  . Gastroesophageal reflux disease 06/17/2017  . Sleep disorder 06/17/2017    Past Surgical History:  Procedure Laterality Date  . APPENDECTOMY      Prior to Admission medications   Medication Sig Start Date End Date Taking? Authorizing Provider  amitriptyline (ELAVIL) 25 MG tablet Take 1 tablet (25 mg total) by mouth at bedtime. 09/17/17   Wendling, Jilda Roche, DO  busPIRone (BUSPAR) 15 MG tablet Take 1 tablet (15 mg total) by mouth 2 (two) times daily. 08/06/17   Sharlene Dory, DO  omeprazole (PRILOSEC) 20 MG capsule Take  1 capsule (20 mg total) by mouth daily. 09/17/17   Sharlene Dory, DO    Allergies Patient has no known allergies.  History reviewed. No pertinent family history.  Social History Social History   Tobacco Use  . Smoking status: Former Games developer  . Smokeless tobacco: Never Used  Substance Use Topics  . Alcohol use: Yes    Comment: denies current use  . Drug use: No    Review of Systems Constitutional: No fever/chills Eyes: No visual changes. ENT: No sore throat but reports it feels scratchy and acidic. Cardiovascular: Denies chest pain. Respiratory: Denies shortness of breath. Gastrointestinal: See HPI Genitourinary: Negative for dysuria. Musculoskeletal: Negative for back pain. Skin: Negative for rash. Neurological: Negative for headaches, areas of focal weakness or numbness.    ____________________________________________   PHYSICAL EXAM:  VITAL SIGNS: ED Triage Vitals  Enc Vitals Group     BP 05/29/18 1854 (!) 150/95     Pulse Rate 05/29/18 1854 74     Resp 05/29/18 1854 18     Temp 05/29/18 1854 98.8 F (37.1 C)     Temp Source 05/29/18 1854 Oral     SpO2 05/29/18 1854 99 %     Weight 05/29/18 1852 190 lb (86.2 kg)     Height 05/29/18 1852 5\' 9"  (1.753  m)     Head Circumference --      Peak Flow --      Pain Score 05/29/18 1851 2     Pain Loc --      Pain Edu? --      Excl. in GC? --     Constitutional: Alert and oriented. Well appearing and in no acute distress. Eyes: Conjunctivae are normal. Head: Atraumatic. Nose: No congestion/rhinnorhea. Mouth/Throat: Mucous membranes are moist.  Posterior oropharynx just slightly erythematous.  Tonsils are normal and the uvula is midline.  There are no masses. Neck: No stridor.  No cervical adenopathy. Cardiovascular: Normal rate, regular rhythm. Grossly normal heart sounds.  Good peripheral circulation. Respiratory: Normal respiratory effort.  No retractions. Lungs CTAB. Gastrointestinal: Soft and  nontender through all quadrants. No distention.  No peritonitis in any quadrant.  Negative Murphy.  No pain McBurney's point.  No left upper quadrant tenderness.  Very reassuring exam. Musculoskeletal: No lower extremity tenderness nor edema. Neurologic:  Normal speech and language. No gross focal neurologic deficits are appreciated.  Skin:  Skin is warm, dry and intact. No rash noted. Psychiatric: Mood and affect are normal. Speech and behavior are normal.  ____________________________________________   LABS (all labs ordered are listed, but only abnormal results are displayed)  Labs Reviewed  BASIC METABOLIC PANEL - Abnormal; Notable for the following components:      Result Value   Calcium 10.8 (*)    All other components within normal limits  CBC  TROPONIN I   ____________________________________________  EKG  Reviewed and entered by me at 1900 Heart rate 70 QRS 99 QTc 400 Normal sinus rhythm, no evidence of acute ischemia or ectopy. ____________________________________________  RADIOLOGY  Dg Chest 2 View  Result Date: 05/29/2018 CLINICAL DATA:  Severe acid reflux x 4 days, progressively getting worse, belching, n/v, LUQ pain, some SOB EXAM: CHEST - 2 VIEW COMPARISON:  None. FINDINGS: The heart size and mediastinal contours are within normal limits. Both lungs are clear. The visualized skeletal structures are unremarkable. IMPRESSION: No active cardiopulmonary disease. Electronically Signed   By: Bary Richard M.D.   On: 05/29/2018 20:03     There is no indication for abdominal imaging.  Reassuring exam without any focal discomfort or peritonitis.  Patient denies abdominal pain.  No obstructive symptoms ____________________________________________   PROCEDURES  Procedure(s) performed: None  Procedures  Critical Care performed: No  ____________________________________________   INITIAL IMPRESSION / ASSESSMENT AND PLAN / ED COURSE  Pertinent labs & imaging  results that were available during my care of the patient were reviewed by me and considered in my medical decision making (see chart for details).   Patient presents for symptoms with seem to be consistent with acid reflux.  Positional acidic taste in the back of his mouth, occasional belching, and he reports a long history of same in the past.  There is no signs or symptoms suggest this is a coronary syndrome, pneumonia, pulmonary process, acute vascular abnormality or intra-abdominal emergency.  Very reassuring clinical exam without acute abdomen and no tenderness by examination.  Discussed with the patient careful return precautions, I would recommend however that he discontinue Tums and start over-the-counter Pepcid and Nexium.  He reports he has been on Nexium or similar medication to it in the past with some relief with his since quit.  I have strongly advised he follow-up with gastroenterology, and he is agreeable with this plan.  Return precautions and treatment recommendations and follow-up discussed with  the patient who is agreeable with the plan.       ____________________________________________   FINAL CLINICAL IMPRESSION(S) / ED DIAGNOSES  Final diagnoses:  Gastroesophageal reflux disease without esophagitis        Note:  This document was prepared using Dragon voice recognition software and may include unintentional dictation errors       Sharyn CreamerQuale, Aivan Fillingim, MD 05/29/18 2032

## 2018-05-29 NOTE — ED Triage Notes (Signed)
Pt states hx of acid reflux. States the last 4 days has been worse. States started with 96 tums 4 days ago and has taken so many that he only has 12 left. States throat pain, CP. Hasn't taken any prilosec or anything OTC. States laying down makes pain worse. States can taste bile.

## 2018-05-29 NOTE — ED Notes (Signed)
Patient states he took pepto-bismol today also. MD aware.

## 2018-07-05 ENCOUNTER — Emergency Department (HOSPITAL_BASED_OUTPATIENT_CLINIC_OR_DEPARTMENT_OTHER)
Admission: EM | Admit: 2018-07-05 | Discharge: 2018-07-05 | Disposition: A | Discharge status: Home / Self Care | Payer: Medicaid Other | Attending: Emergency Medicine | Admitting: Emergency Medicine

## 2018-07-05 ENCOUNTER — Emergency Department (HOSPITAL_BASED_OUTPATIENT_CLINIC_OR_DEPARTMENT_OTHER): Payer: Medicaid Other

## 2018-07-05 ENCOUNTER — Other Ambulatory Visit: Payer: Self-pay

## 2018-07-05 ENCOUNTER — Encounter (HOSPITAL_BASED_OUTPATIENT_CLINIC_OR_DEPARTMENT_OTHER): Payer: Self-pay

## 2018-07-05 DIAGNOSIS — Z79899 Other long term (current) drug therapy: Secondary | ICD-10-CM | POA: Diagnosis not present

## 2018-07-05 DIAGNOSIS — Y9289 Other specified places as the place of occurrence of the external cause: Secondary | ICD-10-CM | POA: Diagnosis not present

## 2018-07-05 DIAGNOSIS — Z87891 Personal history of nicotine dependence: Secondary | ICD-10-CM | POA: Diagnosis not present

## 2018-07-05 DIAGNOSIS — W010XXA Fall on same level from slipping, tripping and stumbling without subsequent striking against object, initial encounter: Secondary | ICD-10-CM | POA: Insufficient documentation

## 2018-07-05 DIAGNOSIS — S39012A Strain of muscle, fascia and tendon of lower back, initial encounter: Secondary | ICD-10-CM | POA: Insufficient documentation

## 2018-07-05 DIAGNOSIS — Y990 Civilian activity done for income or pay: Secondary | ICD-10-CM | POA: Insufficient documentation

## 2018-07-05 DIAGNOSIS — K219 Gastro-esophageal reflux disease without esophagitis: Secondary | ICD-10-CM | POA: Insufficient documentation

## 2018-07-05 DIAGNOSIS — S3992XA Unspecified injury of lower back, initial encounter: Secondary | ICD-10-CM | POA: Diagnosis present

## 2018-07-05 DIAGNOSIS — Y939 Activity, unspecified: Secondary | ICD-10-CM | POA: Diagnosis not present

## 2018-07-05 MED ORDER — IBUPROFEN 800 MG PO TABS
800.0000 mg | ORAL_TABLET | Freq: Once | ORAL | Status: AC
  Administered 2018-07-05: 800 mg via ORAL
  Filled 2018-07-05: qty 1

## 2018-07-05 MED ORDER — CYCLOBENZAPRINE HCL 10 MG PO TABS: 10.0000 mg | ORAL_TABLET | Freq: Two times a day (BID) | ORAL | 0 refills | Status: DC | PRN

## 2018-07-05 MED ORDER — CYCLOBENZAPRINE HCL 10 MG PO TABS
10.0000 mg | ORAL_TABLET | Freq: Once | ORAL | Status: AC
  Administered 2018-07-05: 16:00:00 10 mg via ORAL
  Filled 2018-07-05: qty 1

## 2018-07-05 MED ORDER — HYDROCODONE-ACETAMINOPHEN 5-325 MG PO TABS
1.0000 | ORAL_TABLET | Freq: Once | ORAL | Status: AC
  Administered 2018-07-05: 1 via ORAL
  Filled 2018-07-05: qty 1

## 2018-07-05 MED FILL — CYCLOBENZAPRINE HCL 10 MG T: 10 | 5 days supply | Qty: 10 | Fill #0

## 2018-07-05 NOTE — ED Notes (Signed)
Patient transported to X-ray 

## 2018-07-05 NOTE — Discharge Instructions (Addendum)
Take 800 mg of Motrin every 8 hours for the next 5 days, take 650 mg of Tylenol every 6 hours as well.  Do not mix Flexeril alcohol, drugs, heavy machinery, driving

## 2018-07-05 NOTE — ED Provider Notes (Signed)
[facility] EMERGENCY DEPARTMENT Provider Note   CSN: [id number] Arrival date & time: 07/05/18  1441    History   Chief Complaint Chief Complaint  Patient presents with   Back Pain    HPI Fred Santos is a 28 y.o. male.     The history is provided by the patient.  Fall  This is a new problem. The current episode started 1 to 2 hours ago. The problem has not changed since onset.Pertinent negatives include no chest pain, no abdominal pain, no headaches and no shortness of breath. Associated symptoms comments: Back pain. The symptoms are aggravated by standing. Nothing relieves the symptoms. He has tried nothing for the symptoms. The treatment provided no relief.    Past Medical History:  Diagnosis Date   Allergy    Anxiety    GERD (gastroesophageal reflux disease)    Insomnia     Patient Active Problem List   Diagnosis Date Noted   GAD (generalized anxiety disorder) 08/06/2017   Gastroesophageal reflux disease 06/17/2017   Sleep disorder 06/17/2017    Past Surgical History:  Procedure Laterality Date   APPENDECTOMY          Home Medications    Prior to Admission medications   Medication Sig Start Date End Date Taking? Authorizing Provider  amitriptyline (ELAVIL) 25 MG tablet Take 1 tablet (25 mg total) by mouth at bedtime. 09/17/17   Wendling, Jilda Roche, DO  busPIRone (BUSPAR) 15 MG tablet Take 1 tablet (15 mg total) by mouth 2 (two) times daily. 08/06/17   Sharlene Dory, DO  cyclobenzaprine (FLEXERIL) 10 MG tablet Take 1 tablet (10 mg total) by mouth 2 (two) times daily as needed for up to 10 doses for muscle spasms. 07/05/18   Lue [place], DO  omeprazole (PRILOSEC) 20 MG capsule Take 1 capsule (20 mg total) by mouth daily. 09/17/17   Sharlene Dory, DO    Family History No family history on file.  Social History Social History   Tobacco Use   Smoking status: Former Smoker   Smokeless tobacco: Never Used    Substance Use Topics   Alcohol use: Yes    Comment: denies current use   Drug use: No     Allergies   Patient has no known allergies.   Review of Systems Review of Systems  Constitutional: Negative for chills and fever.  HENT: Negative for ear pain and sore throat.   Eyes: Negative for pain and visual disturbance.  Respiratory: Negative for cough and shortness of breath.   Cardiovascular: Negative for chest pain and palpitations.  Gastrointestinal: Negative for abdominal pain and vomiting.  Genitourinary: Negative for dysuria and hematuria.  Musculoskeletal: Positive for back pain. Negative for arthralgias, gait problem, joint swelling, myalgias, neck pain and neck stiffness.  Skin: Negative for color change and rash.  Neurological: Negative for seizures, syncope and headaches.  All other systems reviewed and are negative.    Physical Exam Updated Vital Signs  ED Triage Vitals  Enc Vitals Group     BP 07/05/18 1445 (!) 148/102     Pulse Rate 07/05/18 1445 82     Resp 07/05/18 1445 17     Temp 07/05/18 1445 98.2 F (36.8 C)     Temp src --      SpO2 07/05/18 1445 99 %     Weight 07/05/18 1442 185 lb (83.9 kg)     Height 07/05/18 1442  (1.753 m)     Head  Circumference --      Peak Flow --      Pain Score 07/05/18 1442 7     Pain Loc --      Pain Edu? --      Excl. in GC? --     Physical Exam Vitals signs and nursing note reviewed.  Constitutional:      Appearance: He is well-developed.  HENT:     Head: Normocephalic and atraumatic.     Nose: Nose normal.     Mouth/Throat:     Mouth: Mucous membranes are moist.  Eyes:     Extraocular Movements: Extraocular movements intact.     Conjunctiva/sclera: Conjunctivae normal.     Pupils: Pupils are equal, round, and reactive to light.  Neck:     Musculoskeletal: Normal range of motion and neck supple. No neck rigidity or muscular tenderness.  Cardiovascular:     Rate and Rhythm: Normal rate and regular  rhythm.     Pulses: Normal pulses.     Heart sounds: Normal heart sounds. No murmur.  Pulmonary:     Effort: Pulmonary effort is normal. No respiratory distress.     Breath sounds: Normal breath sounds.  Abdominal:     Palpations: Abdomen is soft.     Tenderness: There is no abdominal tenderness.  Musculoskeletal: Normal range of motion.        General: Tenderness (paraspinal thoracic and lumbar spine, no midline tenderness) present.  Skin:    General: Skin is warm and dry.  Neurological:     General: No focal deficit present.     Mental Status: He is alert and oriented to person, place, and time.     Cranial Nerves: No cranial nerve deficit.     Sensory: No sensory deficit.     Motor: No weakness.     Coordination: Coordination normal.      ED Treatments / Results  Labs (all labs ordered are listed, but only abnormal results are displayed) Labs Reviewed - No data to display  EKG None  Radiology Dg Thoracic Spine 2 View  Result Date: 07/05/2018 CLINICAL DATA:  Fall at work today.  Back pain.  Worse with motion. EXAM: THORACIC SPINE 2 VIEWS COMPARISON:  Two-view chest x-ray 05/29/2018 FINDINGS: There is no evidence of thoracic spine fracture. Alignment is normal. No other significant bone abnormalities are identified. IMPRESSION: Negative thoracic spine radiographs. Electronically Signed   By: Marin Roberts M.D.   On: 07/05/2018 15:31   Dg Lumbar Spine Complete  Result Date: 07/05/2018 CLINICAL DATA:  Pain. EXAM: LUMBAR SPINE - COMPLETE 4+ VIEW COMPARISON:  02/25/2015 FINDINGS: There is no evidence of lumbar spine fracture. Alignment is normal. Intervertebral disc spaces are maintained. IMPRESSION: Negative. Electronically Signed   By: Signa Kell M.D.   On: 07/05/2018 15:32    Procedures Procedures (including critical care time)  Medications Ordered in ED Medications  cyclobenzaprine (FLEXERIL) tablet 10 mg (10 mg Oral Given 07/05/18 1554)  ibuprofen (ADVIL)  tablet 800 mg (800 mg Oral Given 07/05/18 1554)  HYDROcodone-acetaminophen (NORCO/VICODIN) 5-325 MG per tablet 1 tablet (1 tablet Oral Given 07/05/18 1554)     Initial Impression / Assessment and Plan / ED Course  I have reviewed the triage vital signs and the nursing notes.  Pertinent labs & imaging results that were available during my care of the patient were reviewed by me and considered in my medical decision making (see chart for details).     Earline Saleem is a  28 year old male who presents to the ED with back pain after a fall.  Patient with normal vitals.  No fever.  Patient states that he slipped at work and landed on his buttocks.  Has had mid to lower back pain since.  Feels like he has a muscle spasm.  Has a history of back issues in the past but has never had surgeries.  Denies any numbness, tingling down his legs.  No midline spinal tenderness on exam.  Has bilateral paraspinal tenderness through mid thoracic region down to the low lumbar area.  Patient with no neck pain.  No loss consciousness.  Will obtain x-rays of the thoracic and lumbar spine.  Will treat with Flexeril, Motrin, Norco.  Will re-evaluate after pain medicine imaging.  Patient with unremarkable x-rays.  Felt better after pain medication.  Discharged in ED in good condition.  Written for light duty for work.  Given return precautions.  Recommend follow-up with primary care doctor.  Educated about home exercises.  This chart was dictated using voice recognition software.  Despite best efforts to proofread,  errors can occur which can change the documentation meaning.    Final Clinical Impressions(s) / ED Diagnoses   Final diagnoses:  Back strain, initial encounter    ED Discharge Orders         Ordered    cyclobenzaprine (FLEXERIL) 10 MG tablet  2 times daily PRN     07/05/18 1616           Emilea Goga, Madelaine Bhatdam, DO 07/05/18 1618

## 2018-07-05 NOTE — ED Notes (Signed)
Pt also c/o acid reflux. Asking for medication

## 2018-07-05 NOTE — ED Triage Notes (Signed)
Pt presents via [facility] after slip in kitchen at work. Pt c/o lumbar pain. C-Collar in place.

## 2018-08-17 ENCOUNTER — Other Ambulatory Visit: Payer: Self-pay

## 2018-08-17 ENCOUNTER — Encounter (HOSPITAL_BASED_OUTPATIENT_CLINIC_OR_DEPARTMENT_OTHER): Payer: Self-pay | Admitting: *Deleted

## 2018-08-17 ENCOUNTER — Emergency Department (HOSPITAL_BASED_OUTPATIENT_CLINIC_OR_DEPARTMENT_OTHER)
Admission: EM | Admit: 2018-08-17 | Discharge: 2018-08-17 | Disposition: A | Discharge status: Home / Self Care | Payer: Medicaid Other | Attending: Emergency Medicine | Admitting: Emergency Medicine

## 2018-08-17 DIAGNOSIS — Z87891 Personal history of nicotine dependence: Secondary | ICD-10-CM | POA: Insufficient documentation

## 2018-08-17 DIAGNOSIS — Z79899 Other long term (current) drug therapy: Secondary | ICD-10-CM | POA: Diagnosis not present

## 2018-08-17 DIAGNOSIS — R5381 Other malaise: Secondary | ICD-10-CM | POA: Diagnosis present

## 2018-08-17 DIAGNOSIS — Z20828 Contact with and (suspected) exposure to other viral communicable diseases: Secondary | ICD-10-CM | POA: Insufficient documentation

## 2018-08-17 DIAGNOSIS — R5383 Other fatigue: Secondary | ICD-10-CM | POA: Insufficient documentation

## 2018-08-17 DIAGNOSIS — R630 Anorexia: Secondary | ICD-10-CM | POA: Insufficient documentation

## 2018-08-17 DIAGNOSIS — N4889 Other specified disorders of penis: Secondary | ICD-10-CM | POA: Diagnosis not present

## 2018-08-17 DIAGNOSIS — N489 Disorder of penis, unspecified: Secondary | ICD-10-CM

## 2018-08-17 DIAGNOSIS — J020 Streptococcal pharyngitis: Secondary | ICD-10-CM | POA: Diagnosis not present

## 2018-08-17 LAB — URINALYSIS, MICROSCOPIC (REFLEX): WBC, UA: NONE SEEN WBC/hpf (ref 0–5)

## 2018-08-17 LAB — URINALYSIS, ROUTINE W REFLEX MICROSCOPIC
Bilirubin Urine: NEGATIVE
Glucose, UA: NEGATIVE mg/dL
Ketones, ur: NEGATIVE mg/dL
Leukocytes,Ua: NEGATIVE
Nitrite: NEGATIVE
Protein, ur: NEGATIVE mg/dL
Specific Gravity, Urine: >1.03 — ABNORMAL HIGH (ref 1.005–1.030)
pH: 6 (ref 5.0–8.0)

## 2018-08-17 LAB — COMPREHENSIVE METABOLIC PANEL
ALT: 102 U/L — ABNORMAL HIGH (ref 0–44)
AST: 44 U/L — ABNORMAL HIGH (ref 15–41)
Albumin: 4.9 g/dL (ref 3.5–5.0)
Alkaline Phosphatase: 47 U/L (ref 38–126)
Anion gap: 10 (ref 5–15)
BUN: 18 mg/dL (ref 6–20)
CO2: 27 mmol/L (ref 22–32)
Calcium: 9.7 mg/dL (ref 8.9–10.3)
Chloride: 97 mmol/L — ABNORMAL LOW (ref 98–111)
Creatinine, Ser: 0.77 mg/dL (ref 0.61–1.24)
GFR calc Af Amer: >60 mL/min (ref >60)
GFR calc non Af Amer: >60 mL/min (ref >60)
Glucose, Bld: 117 mg/dL — ABNORMAL HIGH (ref 70–99)
Potassium: 3.7 mmol/L (ref 3.5–5.1)
Sodium: 134 mmol/L — ABNORMAL LOW (ref 135–145)
Total Bilirubin: 1.7 mg/dL — ABNORMAL HIGH (ref 0.3–1.2)
Total Protein: 8.8 g/dL — ABNORMAL HIGH (ref 6.5–8.1)

## 2018-08-17 LAB — CBC
HCT: 45.6 % (ref 39.0–52.0)
Hemoglobin: 15.2 g/dL (ref 13.0–17.0)
MCH: 28.6 pg (ref 26.0–34.0)
MCHC: 33.3 g/dL (ref 30.0–36.0)
MCV: 85.7 fL (ref 80.0–100.0)
Platelets: 367 10*3/uL (ref 150–400)
RBC: 5.32 MIL/uL (ref 4.22–5.81)
RDW: 11.9 % (ref 11.5–15.5)
WBC: 14.2 10*3/uL — ABNORMAL HIGH (ref 4.0–10.5)
nRBC: 0 % (ref 0.0–0.2)

## 2018-08-17 LAB — GROUP A STREP BY PCR: Group A Strep by PCR: DETECTED — AB

## 2018-08-17 LAB — LIPASE, BLOOD: Lipase: 21 U/L (ref 11–51)

## 2018-08-17 MED ORDER — PENICILLIN G BENZATHINE 1200000 UNIT/2ML IM SUSP
1.2000 10*6.[IU] | Freq: Once | INTRAMUSCULAR | Status: AC
  Administered 2018-08-17: 1.2 10*6.[IU] via INTRAMUSCULAR
  Filled 2018-08-17: qty 2

## 2018-08-17 NOTE — ED Triage Notes (Addendum)
States he has been in [place] for the past 10 days. He has no appetite, chills, headache, diarrhea and blisters on his genital area.

## 2018-08-17 NOTE — Discharge Instructions (Signed)
1.  You have been given a shot of penicillin for strep throat. 2.  You have been tested for coronavirus.  Self quarantine until your results have returned. 3.  Have also had some tests done for possible STD.  Avoid all sexual activity and practice safe sex until all results are returned. 4.  Return to the emergency department if you develop high fever, chest pain, shortness of breath, severe headache or other concerning symptoms. 5.  Follow-up with your family doctor for recheck in the next 3 to 5 days.

## 2018-08-17 NOTE — ED Provider Notes (Signed)
[facility] EMERGENCY DEPARTMENT Provider Note   CSN: [id number] Arrival date & time: 08/17/18  1259    History   Chief Complaint No chief complaint on file.   HPI Fred Santos is a 28 y.o. male.     HPI Patient reports he been traveling quite a bit lately for work.  He has been in [place] and [place].  He reports he just recently started getting some general fatigue and loss of appetite.  He reports yesterday he felt like he had some chills but no fever that he knows of.  Reports he is having about 1-2 loose stools per day.  No vomiting.  Denies cough or shortness of breath or chest pain.  Intermittent generalized headache.  Ports at one point he felt like he had stuff he needed to clear out of his throat and then it was kind of stuck together but now that has resolved.  Patient reports he is also has small lesions on his penis.  He reports they do not hurt or burn.  He reports he is sexually active but last sexual activity was 2 to 4 weeks ago.  He reports he is sexually active with one person.  Reports that person gets tested frequently for STDs and was negative.  No burning with urination no penile discharge.  He denies any drug use.  No alcohol use. Past Medical History:  Diagnosis Date  . Allergy   . Anxiety   . GERD (gastroesophageal reflux disease)   . Insomnia     Patient Active Problem List   Diagnosis Date Noted  . GAD (generalized anxiety disorder) 08/06/2017  . Gastroesophageal reflux disease 06/17/2017  . Sleep disorder 06/17/2017    Past Surgical History:  Procedure Laterality Date  . APPENDECTOMY          Home Medications    Prior to Admission medications   Medication Sig Start Date End Date Taking? Authorizing Provider  omeprazole (PRILOSEC) 20 MG capsule Take 1 capsule (20 mg total) by mouth daily. 09/17/17  Yes Sharlene DoryWendling, Nicholas Paul, DO  amitriptyline (ELAVIL) 25 MG tablet Take 1 tablet (25 mg total) by mouth at bedtime. 09/17/17   Wendling,  Jilda RocheNicholas Paul, DO  busPIRone (BUSPAR) 15 MG tablet Take 1 tablet (15 mg total) by mouth 2 (two) times daily. 08/06/17   Sharlene DoryWendling, Nicholas Paul, DO  cyclobenzaprine (FLEXERIL) 10 MG tablet Take 1 tablet (10 mg total) by mouth 2 (two) times daily as needed for up to 10 doses for muscle spasms. 07/05/18   Virgina Norfolkuratolo, Adam, DO    Family History No family history on file.  Social History Social History   Tobacco Use  . Smoking status: Former Games developermoker  . Smokeless tobacco: Never Used  Substance Use Topics  . Alcohol use: Yes    Comment: denies current use  . Drug use: No     Allergies   Patient has no known allergies.   Review of Systems Review of Systems 10 Systems reviewed and are negative for acute change except as noted in the HPI.   Physical Exam Updated Vital Signs BP 133/88   Pulse 93   Temp 99.3 F (37.4 C) (Oral)   Resp 20   Ht 5\' 10"  (1.778 m)   Wt 86.5 kg   SpO2 98%   BMI 27.35 kg/m   Physical Exam Constitutional:      Appearance: He is well-developed.     Comments: Well in appearance.  No distress.  HENT:  Head: Normocephalic and atraumatic.     Nose: Nose normal.     Mouth/Throat:     Comments: Mucous membranes moist and clear.  Dentition is in fair condition.  Posterior pharynx has slight erythema but no exudates or vesicles. Eyes:     Conjunctiva/sclera: Conjunctivae normal.     Pupils: Pupils are equal, round, and reactive to light.  Neck:     Musculoskeletal: Neck supple.  Cardiovascular:     Rate and Rhythm: Normal rate and regular rhythm.     Heart sounds: Normal heart sounds.  Pulmonary:     Effort: Pulmonary effort is normal.     Breath sounds: Normal breath sounds.  Abdominal:     General: Bowel sounds are normal. There is no distension.     Palpations: Abdomen is soft.     Tenderness: There is no abdominal tenderness.  Genitourinary:    Comments: Penis has about 5 very small 2 to 3 mm round pustules that appear almost dry.  No  erythematous base and no ulceration.  These are on the shaft of the penis.  Glans has no lesions.  No scrotal swelling and no lymphadenopathy. Musculoskeletal: Normal range of motion.  Skin:    General: Skin is warm and dry.  Neurological:     Mental Status: He is alert and oriented to person, place, and time.     GCS: GCS eye subscore is 4. GCS verbal subscore is 5. GCS motor subscore is 6.     Coordination: Coordination normal.      ED Treatments / Results  Labs (all labs ordered are listed, but only abnormal results are displayed) Labs Reviewed  GROUP A STREP BY PCR - Abnormal; Notable for the following components:      Result Value   Group A Strep by PCR DETECTED (*)    All other components within normal limits  URINALYSIS, ROUTINE W REFLEX MICROSCOPIC - Abnormal; Notable for the following components:   Specific Gravity, Urine >1.030 (*)    Hgb urine dipstick TRACE (*)    All other components within normal limits  COMPREHENSIVE METABOLIC PANEL - Abnormal; Notable for the following components:   Sodium 134 (*)    Chloride 97 (*)    Glucose, Bld 117 (*)    Total Protein 8.8 (*)    AST 44 (*)    ALT 102 (*)    Total Bilirubin 1.7 (*)    All other components within normal limits  CBC - Abnormal; Notable for the following components:   WBC 14.2 (*)    All other components within normal limits  URINALYSIS, MICROSCOPIC (REFLEX) - Abnormal; Notable for the following components:   Bacteria, UA RARE (*)    All other components within normal limits  NOVEL CORONAVIRUS, NAA (HOSPITAL ORDER, SEND-OUT TO REF LAB)  LIPASE, BLOOD  HSV(HERPES SIMPLEX VRS) I + II AB-IGG  RPR  HIV ANTIBODY (ROUTINE TESTING W REFLEX)    EKG   Radiology No results found.  Procedures Procedures (including critical care time)  Medications Ordered in ED Medications  penicillin g benzathine (BICILLIN LA) 1200000 UNIT/2ML injection 1.2 Million Units (has no administration in time range)      Initial Impression / Assessment and Plan / ED Course  I have reviewed the triage vital signs and the nursing notes.  Pertinent labs & imaging results that were available during my care of the patient were reviewed by me and considered in my medical decision making (see chart for details).  Patient is clinically well in appearance at this time.  He is in no distress.  He is not have any coughing, chest pain or shortness of breath.  Has been traveling.  Possible COVID exposure but no documented known cases.  You feel COVID testing should be obtained.  Patient will self isolate pending his test results.  Also has some small penile lesions.  These are fairly innocuous in appearance.  Will test HSV but at this time does not appear to need empiric treatment.  Patient has apparent low suspicion for exposure vis--vis his partner.  Is sexually active only with women and ports that his partner has been tested and tested negative for STDs.  Will have patient continue to hydrate take Tylenol.  Suspected viral type illness.  Patient otherwise clinically well in appearance.  Patient has tested positive for strep.  He will be given Bicillin LA. he is instructed to continue self isolation however until COVID testing returned due to constellation of symptoms.  Fred SirenJake Hale was evaluated in Emergency Department on 08/17/2018 for the symptoms described in the history of present illness. He was evaluated in the context of the global COVID-19 pandemic, which necessitated consideration that the patient might be at risk for infection with the SARS-CoV-2 virus that causes COVID-19. Institutional protocols and algorithms that pertain to the evaluation of patients at risk for COVID-19 are in a state of rapid change based on information released by regulatory bodies including the CDC and federal and state organizations. These policies and algorithms were followed during the patient's care in the ED.  Final Clinical  Impressions(s) / ED Diagnoses   Final diagnoses:  Malaise and fatigue  Anorexia  Penile lesion  Strep pharyngitis    ED Discharge Orders    None       Arby BarrettePfeiffer, Lakeisa Heninger, MD 08/17/18 [phone]

## 2018-08-18 LAB — RPR: RPR Ser Ql: NONREACTIVE

## 2018-08-18 LAB — HSV(HERPES SIMPLEX VRS) I + II AB-IGG
HSV 1 Glycoprotein G Ab, IgG: <0.91 index (ref 0.00–0.90)
HSV 2 Glycoprotein G Ab, IgG: <0.91 index (ref 0.00–0.90)

## 2018-08-18 LAB — NOVEL CORONAVIRUS, NAA (HOSP ORDER, SEND-OUT TO REF LAB; TAT 18-24 HRS): SARS-CoV-2, NAA: NOT DETECTED

## 2018-08-19 ENCOUNTER — Encounter: Payer: Self-pay | Admitting: Family Medicine

## 2018-08-19 LAB — HIV ANTIBODY (ROUTINE TESTING W REFLEX): HIV Screen 4th Generation wRfx: NONREACTIVE

## 2018-08-20 ENCOUNTER — Emergency Department (HOSPITAL_BASED_OUTPATIENT_CLINIC_OR_DEPARTMENT_OTHER)
Admission: EM | Admit: 2018-08-20 | Discharge: 2018-08-20 | Disposition: A | Discharge status: Home / Self Care | Payer: Medicaid Other | Attending: Emergency Medicine | Admitting: Emergency Medicine

## 2018-08-20 ENCOUNTER — Encounter (HOSPITAL_BASED_OUTPATIENT_CLINIC_OR_DEPARTMENT_OTHER): Payer: Self-pay

## 2018-08-20 ENCOUNTER — Other Ambulatory Visit: Payer: Self-pay

## 2018-08-20 DIAGNOSIS — Z79899 Other long term (current) drug therapy: Secondary | ICD-10-CM | POA: Insufficient documentation

## 2018-08-20 DIAGNOSIS — Z87891 Personal history of nicotine dependence: Secondary | ICD-10-CM | POA: Insufficient documentation

## 2018-08-20 DIAGNOSIS — N489 Disorder of penis, unspecified: Secondary | ICD-10-CM | POA: Diagnosis present

## 2018-08-20 NOTE — ED Notes (Signed)
Follow up from previous visit  Denies any symptoms  Now he has scabs on genital area  States has been close to someone w yeast infections

## 2018-08-20 NOTE — Discharge Instructions (Addendum)
You have been diagnosed today with Penile Lesions.  At this time there does not appear to be the presence of an emergent medical condition, however there is always the potential for conditions to change. Please read and follow the below instructions.  Please return to the Emergency Department immediately for any new or worsening symptoms. Please be sure to follow up with your Primary Care Provider within one week regarding your visit today; please call their office to schedule an appointment even if you are feeling better for a follow-up visit. Please call the urologist Dr. Jeffie Pollock office tomorrow to schedule a follow-up appointment regarding your penile lesions.  Get help right away if: You have a fever and your symptoms suddenly get worse. You start to feel mixed up (confused). You develop pain when you pee or blood in your urine You have testicular pain or swelling You have a very bad headache or a stiff neck. You have very bad joint pains or stiffness. You have jerky movements that you cannot control (seizure). Your rash covers all or most of your body. The rash may or may not be painful. You have blisters that: Are on top of the rash. Grow larger. Grow together. Are painful. Are inside your nose or mouth. You have a rash that: Looks like purple pinprick-sized spots all over your body. Has a "bull's eye" or looks like a target. Is red and painful, causes your skin to peel, and is not from being in the sun too long.  Please read the additional information packets attached to your discharge summary.  Do not take your medicine if  develop an itchy rash, swelling in your mouth or lips, or difficulty breathing; call 911 and seek immediate emergency medical attention if this occurs.

## 2018-08-20 NOTE — ED Provider Notes (Addendum)
[facility] EMERGENCY DEPARTMENT Provider Note   CSN: [id number] Arrival date & time: 08/20/18  1318    History   Chief Complaint Chief Complaint  Patient presents with   Follow-up    HPI Fred Santos is a 28 y.o. male presented today for follow-up visit.  Patient was treated on 08/17/2018 for strep pharyngitis, additionally diagnosed with malaise, fatigue, anorexia and penile lesions.  He was treated with IM penicillin and discharged.  Patient reports that since discharge his sore throat has resolved and he is feeling much improved.  He has additionally been having more energy and eating per normal.  He is having no pain at this time.  His concern and the reason that he return to the emergency department was for his penile lesions to be rechecked.  During last visit he had 5 pustules that were drying on the shaft of his penis.  Patient reports that since his last visit the scabs have "fallen off" and he would like to be rechecked.  Previous lab work: Urinalysis nonacute, rare bacteria/trace hemoglobin Lipase within normal limits CMP with mild elevated LFTs and bilirubin COVID-19 test negative Strep test positive treated with 1,200,000 units penicillin G HSV negative RPR negative HIV nonreactive    HPI  Past Medical History:  Diagnosis Date   Allergy    Anxiety    GERD (gastroesophageal reflux disease)    Insomnia     Patient Active Problem List   Diagnosis Date Noted   GAD (generalized anxiety disorder) 08/06/2017   Gastroesophageal reflux disease 06/17/2017   Sleep disorder 06/17/2017    Past Surgical History:  Procedure Laterality Date   APPENDECTOMY          Home Medications    Prior to Admission medications   Medication Sig Start Date End Date Taking? Authorizing Provider  amitriptyline (ELAVIL) 25 MG tablet Take 1 tablet (25 mg total) by mouth at bedtime. 09/17/17   Wendling, Jilda RocheNicholas Paul, DO  busPIRone (BUSPAR) 15 MG tablet Take 1  tablet (15 mg total) by mouth 2 (two) times daily. 08/06/17   Sharlene DoryWendling, Nicholas Paul, DO  cyclobenzaprine (FLEXERIL) 10 MG tablet Take 1 tablet (10 mg total) by mouth 2 (two) times daily as needed for up to 10 doses for muscle spasms. 07/05/18   Curatolo, Adam, DO  omeprazole (PRILOSEC) 20 MG capsule Take 1 capsule (20 mg total) by mouth daily. 09/17/17   Sharlene DoryWendling, Nicholas Paul, DO    Family History No family history on file.  Social History Social History   Tobacco Use   Smoking status: Former Smoker   Smokeless tobacco: Never Used  Substance Use Topics   Alcohol use: Yes    Comment: denies current use   Drug use: No     Allergies   Patient has no known allergies.   Review of Systems Review of Systems  Constitutional: Negative.  Negative for chills and fever.  Gastrointestinal: Negative.  Negative for abdominal pain, diarrhea, nausea and vomiting.  Genitourinary: Positive for genital sores. Negative for dysuria, flank pain, hematuria, scrotal swelling and testicular pain.  Musculoskeletal: Negative.  Negative for arthralgias and myalgias.   Physical Exam Updated Vital Signs BP (!) 155/88 (BP Location: Left Arm)    Pulse 82    Resp 16    SpO2 98%   Physical Exam Constitutional:      General: He is not in acute distress.    Appearance: Normal appearance. He is well-developed. He is not ill-appearing or diaphoretic.  HENT:     Head: Normocephalic and atraumatic.     Jaw: There is normal jaw occlusion. No trismus.     Right Ear: External ear normal.     Left Ear: External ear normal.     Nose: Nose normal.     Mouth/Throat:     Comments: The patient has normal phonation and is in control of secretions. No stridor.  Midline uvula without edema. Soft palate rises symmetrically. No tonsillar erythema, swelling or exudates. Tongue protrusion is normal, floor of mouth is soft. No trismus. No creptius on neck palpation. No gingival erythema or fluctuance noted. Mucus membranes  moist. Eyes:     General: Vision grossly intact. Gaze aligned appropriately.     Pupils: Pupils are equal, round, and reactive to light.  Neck:     Musculoskeletal: Full passive range of motion without pain, normal range of motion and neck supple. No neck rigidity.     Trachea: Trachea and phonation normal. No tracheal tenderness or tracheal deviation.     Meningeal: Brudzinski's sign absent.  Pulmonary:     Effort: Pulmonary effort is normal. No respiratory distress.  Abdominal:     General: There is no distension.     Palpations: Abdomen is soft.     Tenderness: There is no abdominal tenderness. There is no guarding or rebound.  Genitourinary:    Comments: Chaperone present during genital exam Transport planner.  Multiple lesions present to the shaft of the penis, small pustules/nodules.  No bumps on head of penis, specifically no vesicles concerning for herpes or chancre suggestive of syphilis.  No pain with palpation of the penis/glans, no discharge or urethritis noted.  Scrotum and testicles without erythema/swelling or tenderness to palpation. No palpable hernia noted.  Musculoskeletal: Normal range of motion.  Skin:    General: Skin is warm and dry.  Neurological:     Mental Status: He is alert.     GCS: GCS eye subscore is 4. GCS verbal subscore is 5. GCS motor subscore is 6.     Comments: Speech is clear and goal oriented, follows commands Major Cranial nerves without deficit, no facial droop Moves extremities without ataxia, coordination intact  Psychiatric:        Behavior: Behavior normal.    ED Treatments / Results  Labs (all labs ordered are listed, but only abnormal results are displayed) Labs Reviewed - No data to display  EKG None  Radiology No results found.  Procedures Procedures (including critical care time)  Medications Ordered in ED Medications - No data to display   Initial Impression / Assessment and Plan / ED Course  I have reviewed the triage  vital signs and the nursing notes.  Pertinent labs & imaging results that were available during my care of the patient were reviewed by me and considered in my medical decision making (see chart for details).     Patient's strep pharyngitis appears improved, airway clear no signs of PTA/RPA, Ludwig's angina or other deep tissue infections of the head or neck.  He is tolerating p.o. without difficulty with resolution of all symptoms no further work-up indicated at this time.  As patient is mildly elevated LFTs and bilirubin he is without abdominal tenderness nausea/vomiting and states that symptoms are improving, suspect secondary to viral illness no further work-up indicated at this time patient urged to follow-up with PCP. - As to patient's genital lesions patient feeling improved, his 5 pustules appeared smaller per patient and the scabs have  fallen off he is still concerned of what these may be.  He has negative HIV, syphilis, HSV testing earlier this week.  There is no further emergency department testing indicated at this time I will refer him to urology for further investigation, patient encouraged to call them tomorrow morning to schedule an appointment.  On repeat discussion with patient he reports that his significant other recently tested positive for a yeast infection, he denies having intercourse with her but is concerned because they sleep in the same bed together and he thinks he may have caught her yeast infection.  There are no signs of balanitis or yeast infection on examination and he denies dysuria, rash appears dry without signs of yeast or satellite lesions, I have encouraged him to follow-up with urology for further investigation.  Patient denies any difficulty breathing or swallowing.  Pt has a patent airway without stridor and is handling secretions without difficulty; no angioedema. No blisters, no warmth, no draining sinus tracts, no superficial abscesses, no bullous impetigo, no  vesicles, no desquamation, no target lesions with dusky purpura or a central bulla. No concern for superimposed infection. No concern for SJS, TEN, TSS, tick borne illness, syphilis or other life-threatening condition.   At this time there does not appear to be any evidence of an acute emergency medical condition and the patient appears stable for discharge with appropriate outpatient follow up. Diagnosis was discussed with patient who verbalizes understanding of care plan and is agreeable to discharge. I have discussed return precautions with patient who verbalizes understanding of return precautions. Patient encouraged to follow-up with their PCP and urology. All questions answered.  Patient's case discussed with Dr. Gardiner RhymeZavits who agrees with plan to discharge with Urology follow-up.   Note: Portions of this report may have been transcribed using voice recognition software. Every effort was made to ensure accuracy; however, inadvertent computerized transcription errors may still be present. Final Clinical Impressions(s) / ED Diagnoses   Final diagnoses:  Penile lesion    ED Discharge Orders    None       Bill SalinasMorelli, Magdaleno Lortie A, PA-C 08/20/18 1730    Elizabeth PalauMorelli, Rexton Greulich A, PA-C 08/20/18 1732    Blane OharaZavitz, Joshua, MD 08/21/18 [phone]

## 2018-08-20 NOTE — ED Triage Notes (Signed)
Pt states he is here for a recheck from recent visit as advised-pt denies new c/o-NAD-steady gait

## 2018-08-30 MED FILL — valACYclovir HCL 1 GM TABS: 1 | 10 days supply | Qty: 20 | Fill #0

## 2018-08-30 MED FILL — METRONIDAZOLE 500 MG TABS: 500 | 1 days supply | Qty: 4 | Fill #0

## 2018-10-11 MED FILL — MELOXICAM 7.5 MG TABLET: 7.5 | 30 days supply | Qty: 30 | Fill #0

## 2018-10-11 MED FILL — hydrOXYzine HCL 50 MG TABS: 50 | 30 days supply | Qty: 30 | Fill #0

## 2018-10-11 MED FILL — ESOMEPRAZOLE MAGNESIUM 20 M: 20 | 30 days supply | Qty: 30 | Fill #0

## 2018-10-20 ENCOUNTER — Other Ambulatory Visit: Payer: Self-pay

## 2018-10-20 ENCOUNTER — Emergency Department (HOSPITAL_BASED_OUTPATIENT_CLINIC_OR_DEPARTMENT_OTHER)
Admission: EM | Admit: 2018-10-20 | Discharge: 2018-10-20 | Disposition: A | Discharge status: Home / Self Care | Payer: Medicaid Other | Attending: Emergency Medicine | Admitting: Emergency Medicine

## 2018-10-20 ENCOUNTER — Emergency Department (HOSPITAL_BASED_OUTPATIENT_CLINIC_OR_DEPARTMENT_OTHER): Payer: Medicaid Other

## 2018-10-20 ENCOUNTER — Encounter (HOSPITAL_BASED_OUTPATIENT_CLINIC_OR_DEPARTMENT_OTHER): Payer: Self-pay | Admitting: Emergency Medicine

## 2018-10-20 DIAGNOSIS — Y9389 Activity, other specified: Secondary | ICD-10-CM | POA: Insufficient documentation

## 2018-10-20 DIAGNOSIS — Z87891 Personal history of nicotine dependence: Secondary | ICD-10-CM | POA: Insufficient documentation

## 2018-10-20 DIAGNOSIS — S0181XA Laceration without foreign body of other part of head, initial encounter: Secondary | ICD-10-CM

## 2018-10-20 DIAGNOSIS — Y999 Unspecified external cause status: Secondary | ICD-10-CM | POA: Insufficient documentation

## 2018-10-20 DIAGNOSIS — S0083XA Contusion of other part of head, initial encounter: Secondary | ICD-10-CM | POA: Diagnosis not present

## 2018-10-20 DIAGNOSIS — S098XXA Other specified injuries of head, initial encounter: Secondary | ICD-10-CM | POA: Diagnosis present

## 2018-10-20 DIAGNOSIS — Z79899 Other long term (current) drug therapy: Secondary | ICD-10-CM | POA: Diagnosis not present

## 2018-10-20 DIAGNOSIS — S01112A Laceration without foreign body of left eyelid and periocular area, initial encounter: Secondary | ICD-10-CM | POA: Insufficient documentation

## 2018-10-20 DIAGNOSIS — S0012XA Contusion of left eyelid and periocular area, initial encounter: Secondary | ICD-10-CM | POA: Insufficient documentation

## 2018-10-20 DIAGNOSIS — Z23 Encounter for immunization: Secondary | ICD-10-CM | POA: Diagnosis not present

## 2018-10-20 DIAGNOSIS — Y92810 Car as the place of occurrence of the external cause: Secondary | ICD-10-CM | POA: Diagnosis not present

## 2018-10-20 DIAGNOSIS — S060X0A Concussion without loss of consciousness, initial encounter: Secondary | ICD-10-CM | POA: Diagnosis not present

## 2018-10-20 MED ORDER — HYDROCODONE-ACETAMINOPHEN 5-325 MG PO TABS: 1.0000 | ORAL_TABLET | ORAL | 0 refills | Status: DC | PRN

## 2018-10-20 MED ORDER — ONDANSETRON 8 MG PO TBDP: 8.0000 mg | ORAL_TABLET | Freq: Three times a day (TID) | ORAL | 0 refills | Status: DC | PRN

## 2018-10-20 MED ORDER — TETANUS-DIPHTH-ACELL PERTUSSIS 5-2.5-18.5 LF-MCG/0.5 IM SUSP
0.5000 mL | Freq: Once | INTRAMUSCULAR | Status: AC
  Administered 2018-10-20: 0.5 mL via INTRAMUSCULAR
  Filled 2018-10-20: qty 0.5

## 2018-10-20 MED FILL — ONDANSETRON ODT 8 MG TABLET: 8 | 3 days supply | Qty: 10 | Fill #0

## 2018-10-20 MED FILL — HYDROCODON-APAP 5-325: 5-325 | 2 days supply | Qty: 12 | Fill #0
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
## 2018-10-20 NOTE — ED Provider Notes (Signed)
Pt signed out by Dr. Florina Ou pending results of CT scan.    IMPRESSION:  1. Left infraorbital soft tissue swelling and laceration without  underlying fracture or foreign body.  2. No other acute trauma.  3. Remote nasal bone fractures.     Pt is feeling better than when he came in.  He is given a note for work.  He knows to return here if worse.  F/u with pcp.   Isla Pence, MD 10/20/18 [id number]
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
## 2018-11-29 MED FILL — MELOXICAM 15 MG TABLET: 15 | 30 days supply | Qty: 30 | Fill #0

## 2018-11-29 MED FILL — ESOMEPRAZOLE MAGNESIUM 20 M: 20 | 30 days supply | Qty: 30 | Fill #1

## 2018-11-29 MED FILL — MIRTAZAPINE 15 MG TABLET: 15 | 30 days supply | Qty: 30 | Fill #0

## 2018-12-14 ENCOUNTER — Ambulatory Visit
Admission: EM | Admit: 2018-12-14 | Discharge: 2018-12-14 | Disposition: A | Discharge status: Home / Self Care | Payer: Medicaid Other

## 2018-12-14 DIAGNOSIS — Z23 Encounter for immunization: Secondary | ICD-10-CM

## 2018-12-14 DIAGNOSIS — T6591XA Toxic effect of unspecified substance, accidental (unintentional), initial encounter: Secondary | ICD-10-CM | POA: Diagnosis not present

## 2018-12-14 DIAGNOSIS — T23102A Burn of first degree of left hand, unspecified site, initial encounter: Secondary | ICD-10-CM

## 2018-12-14 MED ORDER — IBUPROFEN 600 MG PO TABS: 600.0000 mg | ORAL_TABLET | Freq: Four times a day (QID) | ORAL | 0 refills | Status: DC | PRN

## 2018-12-14 MED ORDER — TETANUS-DIPHTHERIA TOXOIDS TD 5-2 LFU IM INJ
0.5000 mL | INJECTION | Freq: Once | INTRAMUSCULAR | Status: AC
  Administered 2018-12-14: 0.5 mL via INTRAMUSCULAR

## 2018-12-14 NOTE — ED Triage Notes (Signed)
When pt. was asked if he was having sucidal thoughts he states "Well....theres nothing y'all can do about it today, ill be fine. Ive talked to my primary care about it'. Pt. Educated to go to ED if si/hi. Pt. Denies plan at this time.
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
## 2018-12-14 NOTE — Discharge Instructions (Signed)
Rinse or soak the burn under cool water. Do this for several minutes. Do not put ice on your burn. That can cause more damage. Lightly cover the burn with a clean (sterile) cloth (dressing). You do not need any antibiotics

## 2018-12-15 ENCOUNTER — Telehealth: Payer: Self-pay | Admitting: Emergency Medicine

## 2018-12-15 NOTE — Telephone Encounter (Signed)
Checked in on patient, discussed medications, and encouraged return call with any continuing questions or concerns.    

## 2019-05-27 ENCOUNTER — Emergency Department (HOSPITAL_BASED_OUTPATIENT_CLINIC_OR_DEPARTMENT_OTHER)
Admission: EM | Admit: 2019-05-27 | Discharge: 2019-05-27 | Disposition: A | Discharge status: Home / Self Care | Payer: Medicaid Other | Attending: Emergency Medicine | Admitting: Emergency Medicine

## 2019-05-27 ENCOUNTER — Other Ambulatory Visit: Payer: Self-pay

## 2019-05-27 ENCOUNTER — Encounter (HOSPITAL_BASED_OUTPATIENT_CLINIC_OR_DEPARTMENT_OTHER): Payer: Self-pay | Admitting: *Deleted

## 2019-05-27 DIAGNOSIS — J45909 Unspecified asthma, uncomplicated: Secondary | ICD-10-CM | POA: Diagnosis not present

## 2019-05-27 DIAGNOSIS — U071 COVID-19: Secondary | ICD-10-CM | POA: Diagnosis not present

## 2019-05-27 DIAGNOSIS — Z87891 Personal history of nicotine dependence: Secondary | ICD-10-CM | POA: Diagnosis not present

## 2019-05-27 DIAGNOSIS — J069 Acute upper respiratory infection, unspecified: Secondary | ICD-10-CM

## 2019-05-27 DIAGNOSIS — R509 Fever, unspecified: Secondary | ICD-10-CM | POA: Diagnosis present

## 2019-05-27 LAB — CBC WITH DIFFERENTIAL/PLATELET
Abs Immature Granulocytes: 0 10*3/uL (ref 0.00–0.07)
Basophils Absolute: 0 10*3/uL (ref 0.0–0.1)
Basophils Relative: 0 %
Eosinophils Absolute: 0.1 10*3/uL (ref 0.0–0.5)
Eosinophils Relative: 2 %
HCT: 43.9 % (ref 39.0–52.0)
Hemoglobin: 14.7 g/dL (ref 13.0–17.0)
Immature Granulocytes: 0 %
Lymphocytes Relative: 26 %
Lymphs Abs: 1.2 10*3/uL (ref 0.7–4.0)
MCH: 28.2 pg (ref 26.0–34.0)
MCHC: 33.5 g/dL (ref 30.0–36.0)
MCV: 84.1 fL (ref 80.0–100.0)
Monocytes Absolute: 0.6 10*3/uL (ref 0.1–1.0)
Monocytes Relative: 14 %
Neutro Abs: 2.7 10*3/uL (ref 1.7–7.7)
Neutrophils Relative %: 58 %
Platelets: 311 10*3/uL (ref 150–400)
RBC: 5.22 MIL/uL (ref 4.22–5.81)
RDW: 12.1 % (ref 11.5–15.5)
WBC: 4.7 10*3/uL (ref 4.0–10.5)
nRBC: 0 % (ref 0.0–0.2)

## 2019-05-27 LAB — COMPREHENSIVE METABOLIC PANEL
ALT: 78 U/L — ABNORMAL HIGH (ref 0–44)
AST: 41 U/L (ref 15–41)
Albumin: 4.5 g/dL (ref 3.5–5.0)
Alkaline Phosphatase: 40 U/L (ref 38–126)
Anion gap: 10 (ref 5–15)
BUN: 14 mg/dL (ref 6–20)
CO2: 23 mmol/L (ref 22–32)
Calcium: 9 mg/dL (ref 8.9–10.3)
Chloride: 105 mmol/L (ref 98–111)
Creatinine, Ser: 0.73 mg/dL (ref 0.61–1.24)
GFR calc Af Amer: >60 mL/min (ref >60)
GFR calc non Af Amer: >60 mL/min (ref >60)
Glucose, Bld: 95 mg/dL (ref 70–99)
Potassium: 3.4 mmol/L — ABNORMAL LOW (ref 3.5–5.1)
Sodium: 138 mmol/L (ref 135–145)
Total Bilirubin: 2.3 mg/dL — ABNORMAL HIGH (ref 0.3–1.2)
Total Protein: 7.7 g/dL (ref 6.5–8.1)

## 2019-05-27 LAB — SARS CORONAVIRUS 2 (TAT 6-24 HRS): SARS Coronavirus 2: POSITIVE — AB

## 2019-05-27 MED ORDER — SODIUM CHLORIDE 0.9 % IV BOLUS
1000.0000 mL | Freq: Once | INTRAVENOUS | Status: AC
  Administered 2019-05-27: 11:00:00 1000 mL via INTRAVENOUS

## 2019-05-27 MED ORDER — ONDANSETRON 4 MG PO TBDP: 4.0000 mg | ORAL_TABLET | Freq: Three times a day (TID) | ORAL | 0 refills | Status: DC | PRN

## 2019-05-27 MED ORDER — HYDROXYZINE HCL 25 MG PO TABS: 25.0000 mg | ORAL_TABLET | Freq: Four times a day (QID) | ORAL | 0 refills | Status: DC | PRN

## 2019-05-27 MED FILL — hydrOXYzine HCL 25 MG TABS: 25 | 4 days supply | Qty: 15 | Fill #0

## 2019-05-27 MED FILL — ONDANSETRON ODT 4MG TBDP: 4 | 7 days supply | Qty: 20 | Fill #0

## 2019-05-27 NOTE — ED Notes (Signed)
Patient voiced of hopelessness and mental breakdown.  He has a history of anxiety.  EDP is made aware of this conversation.

## 2019-05-27 NOTE — Discharge Instructions (Addendum)
Your symptoms are concerning for viral infection versus seasonal allergies.  Please take Tylenol or ibuprofen as needed for fever control.  As for her sneezing and other congestion symptoms, I recommend daily antihistamines as well as Flonase which can be obtained over-the-counter at a pharmacy.  I have prescribed you Zofran to take as needed for nausea control.  I cannot emphasize enough the importance of increased oral hydration.  As for your anxiety, I recommend that you read the resource guide attached for counseling in the event that you experience any worsening symptoms.  I advise he come to the ED, preferably [facility] or [facility], if you develop worsening anxiety/depression symptoms.  Please maintain isolation precautions pending results of your COVID-19 testing.  Please return to any ED should you experience any new or worsening problems.

## 2019-05-27 NOTE — ED Triage Notes (Signed)
Fever started this past Monday with left ear ache, both nostrils feels like it's burning.  Extreme body ache.  Fever seems to be only in the evening.  Vomited x 3 today. Feeling extremely stressed.

## 2019-05-27 NOTE — ED Provider Notes (Signed)
[facility] EMERGENCY DEPARTMENT Provider Note   CSN: [id number] Arrival date & time: 05/27/19  0940     History Chief Complaint  Patient presents with  . Fever    Fred Santos is a 29 y.o. male with PMH significant for seasonal allergies and anxiety presents to the ED accompanied by his mother with multiple complaints.  Patient reports that he has been experiencing a mild cough, sneezing, and itchy eyes that he attributes to seasonal allergies.  He tried taking a generic allergy medication over-the-counter for one day before subsequently stopping.  Patient states that he has been having intermittent fevers, with T-max being 101.7 F on Wednesday evening, shortly after stepping out of a very hot shower.  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  He complains of 4-day history of left ear discomfort, as well.  Patient also has chronic back and joint pain.  He did have an episode of nonbloody emesis in the past couple of days and has not been eating or drinking due to diminished appetite.  He denies any productive cough or shortness of breath symptoms, chest pain, abdominal pain, urinary symptoms, or change in bowel habits.  He recently had to fire his primary care provider because he did not agree with her.  HPI     Past Medical History:  Diagnosis Date  . Allergy   . Anxiety   . GERD (gastroesophageal reflux disease)   . Insomnia     Patient Active Problem List   Diagnosis Date Noted  . GAD (generalized anxiety disorder) 08/06/2017  . Gastroesophageal reflux disease 06/17/2017  . Sleep disorder 06/17/2017    Past Surgical History:  Procedure Laterality Date  . APPENDECTOMY         Family History  Problem Relation Age of Onset  . Healthy Mother   . Hypertension Mother   . Diabetes Mother   . Healthy Father     Social History   Tobacco Use  . Smoking status: Former Games developer  . Smokeless tobacco: Never Used    Substance Use Topics  . Alcohol use: Not Currently    Comment: denies current use  . Drug use: Yes    Types: Marijuana    Comment: more than a week    Home Medications Prior to Admission medications   Medication Sig Start Date End Date Taking? Authorizing Provider  esomeprazole (NEXIUM) 20 MG capsule Take 20 mg by mouth daily at 12 noon.    [provider]  hydrOXYzine (ATARAX/VISTARIL) 50 MG tablet  10/11/18   [provider]  ibuprofen (ADVIL) 600 MG tablet Take 1 tablet (600 mg total) by mouth every 6 (six) hours as needed. 12/14/18   Lurline Idol, FNP  meloxicam (MOBIC) 7.5 MG tablet  10/11/18   [provider]  mirtazapine (REMERON) 30 MG tablet Take 30 mg by mouth at bedtime.    [provider]  omeprazole (PRILOSEC) 20 MG capsule Take 1 capsule (20 mg total) by mouth daily. 09/17/17   Sharlene Dory, DO  ondansetron (ZOFRAN ODT) 4 MG disintegrating tablet Take 1 tablet (4 mg total) by mouth every 8 (eight) hours as needed for nausea or vomiting. 05/27/19   Lorelee New, PA-C    Allergies    Patient has no known allergies.  Review of Systems   Review of Systems  All other systems reviewed and are negative.   Physical Exam Updated Vital  Signs BP 134/86 (BP Location: Left Arm)   Pulse 68   Temp 99 F (37.2 C) (Oral)   Resp 16   Ht 5\' 10"  (1.778 m)   Wt 86.2 kg   SpO2 98%   BMI 27.26 kg/m   Physical Exam Vitals and nursing note reviewed. Exam conducted with a chaperone present.  Constitutional:      General: He is not in acute distress.    Appearance: Normal appearance. He is not ill-appearing.  HENT:     Head: Normocephalic and atraumatic.     Right Ear: Tympanic membrane, ear canal and external ear normal.     Left Ear: Tympanic membrane, ear canal and external ear normal. There is impacted cerumen.     Nose: Nose normal.     Mouth/Throat:     Pharynx: Oropharynx is clear.  Eyes:     General: No scleral  icterus.    Conjunctiva/sclera: Conjunctivae normal.  Cardiovascular:     Rate and Rhythm: Normal rate and regular rhythm.     Pulses: Normal pulses.     Heart sounds: Normal heart sounds.  Pulmonary:     Effort: Pulmonary effort is normal. No respiratory distress.     Breath sounds: Normal breath sounds. No wheezing or rales.  Abdominal:     General: Abdomen is flat. There is no distension.     Palpations: Abdomen is soft.     Tenderness: There is no abdominal tenderness. There is no guarding.  Musculoskeletal:     Cervical back: Normal range of motion. No rigidity.  Skin:    General: Skin is dry.  Neurological:     Mental Status: He is alert.     GCS: GCS eye subscore is 4. GCS verbal subscore is 5. GCS motor subscore is 6.  Psychiatric:        Mood and Affect: Mood normal.        Behavior: Behavior normal.        Thought Content: Thought content normal.     ED Results / Procedures / Treatments   Labs (all labs ordered are listed, but only abnormal results are displayed) Labs Reviewed  COMPREHENSIVE METABOLIC PANEL - Abnormal; Notable for the following components:      Result Value   Potassium 3.4 (*)    ALT 78 (*)    Total Bilirubin 2.3 (*)    All other components within normal limits  SARS CORONAVIRUS 2 (TAT 6-24 HRS)  CBC WITH DIFFERENTIAL/PLATELET    EKG None  Radiology No results found.  Procedures Procedures (including critical care time)  Medications Ordered in ED Medications  sodium chloride 0.9 % bolus 1,000 mL (0 mLs Intravenous Stopped 05/27/19 1233)    ED Course  I have reviewed the triage vital signs and the nursing notes.  Pertinent labs & imaging results that were available during my care of the patient were reviewed by me and considered in my medical decision making (see chart for details).    MDM Rules/Calculators/A&P                      There was a mild cerumen impaction in patient's left ear.  After the impaction was removed, I was  able to visualize the TM which appears normal.  No evidence to suggest middle or external ear infection.  No tragus TTP.  No mastoiditis.  Canals are not swollen.  His oropharynx was visualized and no concerning physical exam findings.  He denies  any significant sore throat symptoms.  No rales appreciated on exam and he states that his cough, nonproductive, is mild and intermittent.  Low suspicion for pneumonia.  Patient is also endorsing diminished appetite as well as some nausea and vomiting that began today.  He suspects this is attributed to his anxiety which he reports has been much worse in the past week.  He is unsure as to whether or not his anxiety is contributing to all of his symptoms.  He does appear anxious on exam.  He denies any SI, HI, or AVH.  Will provide him with resources should he experience any difficulties with his anxiety after discharge.  His laboratory work-up is reviewed and is entirely reassuring.  CMP demonstrates no significant electrolyte derangement.  Mild hypokalemia to 3.4, will asked that he replenished with potatoes and bananas at home.  Mild transaminitis with ALT of 78 and mildly elevated bilirubin, however no right upper quadrant tenderness to palpation.  Low suspicion for hepatic or gallbladder etiology.   Do not feel as though her upper quadrant ultrasound is warranted.  While patient endorsed back discomfort, he states that it is chronic.  No history of IVDA.  No weakness or numbness in extremities.  No incontinence.  Low suspicion for epidural abscess or other acute, emergent spinal pathology.  On subsequent evaluation, his mother who is in the room with him states that she is also been experiencing generalized symptoms of cough and body aches.  Neither of them have been vaccinated for COVID-19.  It is reasonable to obtain COVID-19 testing here in the ED.  Advising that patient use Tylenol or ibuprofen as needed for fever control.  Emphasized the importance of oral  hydration.  Will p.o. challenge patient prior to discharge.  Will prescribe short course of Zofran ODT as needed for nausea symptoms.  Also suspect that seasonal allergies may be playing a role.  Encouraging to continue daily antihistamines in addition to Flonase and sinus rinses as needed.  Discussed with Dr. Gilford Raid who agrees with assessment and plan.  Patient is in the process of getting established with a new primary care provider.  Encouraged him to follow-up with them as soon as possible.  Strict ED return precautions discussed.  All of the evaluation and work-up results were discussed with the patient and any family at bedside. They were provided opportunity to ask any additional questions and have none at this time. They have expressed understanding of verbal discharge instructions as well as return precautions and are agreeable to the plan.    Final Clinical Impression(s) / ED Diagnoses Final diagnoses:  Viral upper respiratory tract infection    Rx / DC Orders ED Discharge Orders         Ordered    ondansetron (ZOFRAN ODT) 4 MG disintegrating tablet  Every 8 hours PRN     05/27/19 1318           Reita Chard 05/27/19 1320    Isla Pence, MD 05/27/19 1421

## 2019-06-28 ENCOUNTER — Encounter (HOSPITAL_BASED_OUTPATIENT_CLINIC_OR_DEPARTMENT_OTHER): Payer: Self-pay | Admitting: *Deleted

## 2019-06-28 ENCOUNTER — Other Ambulatory Visit: Payer: Self-pay

## 2019-06-28 ENCOUNTER — Emergency Department (HOSPITAL_BASED_OUTPATIENT_CLINIC_OR_DEPARTMENT_OTHER)
Admission: EM | Admit: 2019-06-28 | Discharge: 2019-06-28 | Disposition: A | Discharge status: Home / Self Care | Payer: Medicaid Other | Attending: Emergency Medicine | Admitting: Emergency Medicine

## 2019-06-28 DIAGNOSIS — M79642 Pain in left hand: Secondary | ICD-10-CM | POA: Diagnosis present

## 2019-06-28 DIAGNOSIS — Z87891 Personal history of nicotine dependence: Secondary | ICD-10-CM | POA: Diagnosis not present

## 2019-06-28 DIAGNOSIS — Z79899 Other long term (current) drug therapy: Secondary | ICD-10-CM | POA: Diagnosis not present

## 2019-06-28 DIAGNOSIS — M65842 Other synovitis and tenosynovitis, left hand: Secondary | ICD-10-CM | POA: Insufficient documentation

## 2019-06-28 DIAGNOSIS — M659 Synovitis and tenosynovitis, unspecified: Secondary | ICD-10-CM

## 2019-06-28 MED ORDER — IBUPROFEN 600 MG PO TABS: 600.0000 mg | ORAL_TABLET | Freq: Four times a day (QID) | ORAL | 0 refills | Status: AC | PRN

## 2019-06-28 MED ORDER — IBUPROFEN 800 MG PO TABS: 800.0000 mg | ORAL_TABLET | Freq: Three times a day (TID) | ORAL | 0 refills | Status: AC | PRN

## 2019-06-28 NOTE — ED Provider Notes (Signed)
[facility] EMERGENCY DEPARTMENT Provider Note   CSN: [id number] Arrival date & time: 06/28/19  1414     History Chief Complaint  Patient presents with  . Hand Pain    Fred Santos is a 29 y.o. male.  The history is provided by the patient. No language interpreter was used.  Hand Pain This is a new problem. The current episode started more than 1 week ago. The problem occurs constantly. The problem has not changed since onset.Nothing aggravates the symptoms. Nothing relieves the symptoms. He has tried nothing for the symptoms. The treatment provided no relief.  Pt reports he has pain in his left hand. Pt does engine work.  Pt does a lot of repeptive movement      Past Medical History:  Diagnosis Date  . Allergy   . Anxiety   . GERD (gastroesophageal reflux disease)   . Insomnia     Patient Active Problem List   Diagnosis Date Noted  . GAD (generalized anxiety disorder) 08/06/2017  . Gastroesophageal reflux disease 06/17/2017  . Sleep disorder 06/17/2017    Past Surgical History:  Procedure Laterality Date  . APPENDECTOMY         Family History  Problem Relation Age of Onset  . Healthy Mother   . Hypertension Mother   . Diabetes Mother   . Healthy Father     Social History   Tobacco Use  . Smoking status: Former Games developer  . Smokeless tobacco: Never Used  Substance Use Topics  . Alcohol use: Not Currently    Comment: denies current use  . Drug use: Yes    Types: Marijuana    Comment: more than a week    Home Medications Prior to Admission medications   Medication Sig Start Date End Date Taking? Authorizing Provider  esomeprazole (NEXIUM) 20 MG capsule Take 20 mg by mouth daily at 12 noon.    [provider]  hydrOXYzine (ATARAX/VISTARIL) 25 MG tablet Take 1 tablet (25 mg total) by mouth every 6 (six) hours as needed for anxiety. 05/27/19   Lorelee New, PA-C  ibuprofen (ADVIL) 600 MG tablet Take 1 tablet (600 mg total) by mouth  every 6 (six) hours as needed. 12/14/18   Lurline Idol, FNP  meloxicam (MOBIC) 7.5 MG tablet  10/11/18   [provider]  mirtazapine (REMERON) 30 MG tablet Take 30 mg by mouth at bedtime.    [provider]  omeprazole (PRILOSEC) 20 MG capsule Take 1 capsule (20 mg total) by mouth daily. 09/17/17   Sharlene Dory, DO  ondansetron (ZOFRAN ODT) 4 MG disintegrating tablet Take 1 tablet (4 mg total) by mouth every 8 (eight) hours as needed for nausea or vomiting. 05/27/19   Lorelee New, PA-C    Allergies    Patient has no known allergies.  Review of Systems   Review of Systems  All other systems reviewed and are negative.   Physical Exam Updated Vital Signs BP 127/86 (BP Location: Right Arm)   Pulse 73   Temp 98.9 F (37.2 C) (Oral)   Resp 16   Ht 5\' 11"  (1.803 m)   Wt 87.1 kg   SpO2 99%   BMI 26.78 kg/m   Physical Exam Vitals reviewed.  HENT:     Mouth/Throat:     Mouth: Mucous membranes are moist.  Cardiovascular:     Pulses: Normal pulses.  Musculoskeletal:        General: Swelling and tenderness present. Normal range  of motion.     Cervical back: Normal range of motion.     Comments: Tender left thumb and wrist nv and ns intact   Skin:    General: Skin is warm.     Capillary Refill: Capillary refill takes less than 2 seconds.  Neurological:     General: No focal deficit present.  Psychiatric:        Mood and Affect: Mood normal.     ED Results / Procedures / Treatments   Labs (all labs ordered are listed, but only abnormal results are displayed) Labs Reviewed - No data to display  EKG None  Radiology No results found.  Procedures Procedures (including critical care time)  Medications Ordered in ED Medications - No data to display  ED Course  I have reviewed the triage vital signs and the nursing notes.  Pertinent labs & imaging results that were available during my care of the patient were reviewed by me and  considered in my medical decision making (see chart for details).    MDM Rules/Calculators/A&P                      MDM:  Pt placed in a splint.  Pt given rx for ibuprofen, Pt given number for follow up with Dr. Rosiland Oz.  Final Clinical Impression(s) / ED Diagnoses Final diagnoses:  Tenosynovitis    Rx / DC Orders ED Discharge Orders         Ordered    ibuprofen (ADVIL) 800 MG tablet  Every 8 hours PRN     06/28/19 1507    ibuprofen (ADVIL) 600 MG tablet  Every 6 hours PRN     06/28/19 1507           Fransico Meadow, [place] 06/28/19 1511    Little, Wenda Overland, MD 07/02/19 [phone]

## 2019-06-28 NOTE — ED Triage Notes (Signed)
Pt reports 2 weeks of left hand pain, denies any memory of injury or trauma, top of left hand is slightly swollen, states someone handed him a tool today and his pain shot from his left hand up his arm.

## 2019-06-30 ENCOUNTER — Other Ambulatory Visit: Payer: Self-pay

## 2019-06-30 ENCOUNTER — Ambulatory Visit: Payer: Self-pay

## 2019-06-30 ENCOUNTER — Ambulatory Visit: Payer: Medicaid Other | Admitting: Family Medicine

## 2019-06-30 VITALS — BP 120/70 | Ht 71.0 in | Wt 196.0 lb

## 2019-06-30 DIAGNOSIS — M25532 Pain in left wrist: Secondary | ICD-10-CM

## 2019-06-30 DIAGNOSIS — M25542 Pain in joints of left hand: Secondary | ICD-10-CM | POA: Insufficient documentation

## 2019-06-30 MED ORDER — PREDNISONE 5 MG PO TABS: ORAL_TABLET | ORAL | 0 refills | Status: DC

## 2019-06-30 NOTE — Patient Instructions (Signed)
Nice to meet you  Please try the medicine  You can use the splint if it helps  Please send me a message in MyChart with any questions or updates.  Please see me back in 4 weeks or sooner if needed.   --Dr. Jordan Likes

## 2019-06-30 NOTE — Progress Notes (Signed)
Fred Santos - 29 y.o. male MRN [id number]  Date of birth: Jan 24, 1991  SUBJECTIVE:  Including CC & ROS.  No chief complaint on file.   Fred Santos is a 29 y.o. male that is presenting with left wrist pain.  Is occurring over the dorsum of the hand as well.  He denies any inciting event or trauma.  He has intermittent pain with no specific inciting event.  It has some swelling associated with it.  Has tried using a thumb spica with limited improvement.  Denies any history of fracture or surgery.  Symptoms are intermittent.   Review of Systems See HPI   HISTORY: Past Medical, Surgical, Social, and Family History Reviewed & Updated per EMR.   Pertinent Historical Findings include:  Past Medical History:  Diagnosis Date  . Allergy   . Anxiety   . GERD (gastroesophageal reflux disease)   . Insomnia     Past Surgical History:  Procedure Laterality Date  . APPENDECTOMY      Family History  Problem Relation Age of Onset  . Healthy Mother   . Hypertension Mother   . Diabetes Mother   . Healthy Father     Social History   Socioeconomic History  . Marital status: Single    Spouse name: Not on file  . Number of children: Not on file  . Years of education: Not on file  . Highest education level: Not on file  Occupational History  . Not on file  Tobacco Use  . Smoking status: Former Games developer  . Smokeless tobacco: Never Used  Substance and Sexual Activity  . Alcohol use: Not Currently    Comment: denies current use  . Drug use: Yes    Types: Marijuana    Comment: more than a week  . Sexual activity: Yes    Partners: Female  Other Topics Concern  . Not on file  Social History Narrative  . Not on file   Social Determinants of Health   Financial Resource Strain:   . Difficulty of Paying Living Expenses:   Food Insecurity:   . Worried About Programme researcher, broadcasting/film/video in the Last Year:   . Barista in the Last Year:   Transportation Needs:   . Freight forwarder  (Medical):   Marland Kitchen Lack of Transportation (Non-Medical):   Physical Activity:   . Days of Exercise per Week:   . Minutes of Exercise per Session:   Stress:   . Feeling of Stress :   Social Connections:   . Frequency of Communication with Friends and Family:   . Frequency of Social Gatherings with Friends and Family:   . Attends Religious Services:   . Active Member of Clubs or Organizations:   . Attends Banker Meetings:   Marland Kitchen Marital Status:   Intimate Partner Violence:   . Fear of Current or Ex-Partner:   . Emotionally Abused:   Marland Kitchen Physically Abused:   . Sexually Abused:      PHYSICAL EXAM:  VS: BP 120/70   Ht 5\' 11"  (1.803 m)   Wt 196 lb (88.9 kg)   BMI 27.34 kg/m  Physical Exam Gen: NAD, alert, cooperative with exam, well-appearing MSK:  Left hand/wrist: No obvious swelling or ecchymosis. Normal range of motion. Normal grip strength. No redness. Normal thumb with resistance to extension and flexion. Negative clunk test. Negative grind test. Negative Finkelstein's test. Neurovascularly intact  Limited ultrasound: Left wrist/hand:  No synovial changes appreciated of the  second or third MCP joints. Normal-appearing second and third metacarpal. Normal CMC joint. Normal first dorsal compartment  There may be a mildly increased effusion in the radioscaphoid joint  Summary: Possible effusion in the carpal joints when compared to the contralateral side.  Ultrasound and interpretation by Clearance Coots, MD    ASSESSMENT & PLAN:   Left wrist pain Nonspecific pain at the dorsum of the hand and around the wrist.  No inciting event.  Could be associated with an inflammatory change but no hyperemia observed on ultrasound.  There seems to be more of an effusion within the carpal joints when compared to the contralateral side.  No history of gout or family history of inflammatory or autoimmune disease. -Counseled on home exercise therapy and supportive  care. -Prednisone. -Could use brace as needed. -Could consider imaging or lab work.

## 2019-06-30 NOTE — Assessment & Plan Note (Signed)
Nonspecific pain at the dorsum of the hand and around the wrist.  No inciting event.  Could be associated with an inflammatory change but no hyperemia observed on ultrasound.  There seems to be more of an effusion within the carpal joints when compared to the contralateral side.  No history of gout or family history of inflammatory or autoimmune disease. -Counseled on home exercise therapy and supportive care. -Prednisone. -Could use brace as needed. -Could consider imaging or lab work.

## 2019-07-17 IMAGING — DX THORACIC SPINE 2 VIEWS
3 series · 3 of 3 positions shown · non-contrast
Comparison: Two-view chest x-ray 05/29/2018

CLINICAL DATA: Fall at work today.  Back pain.  Worse with motion.

EXAM:
THORACIC SPINE 2 VIEWS

[t-spine ap]
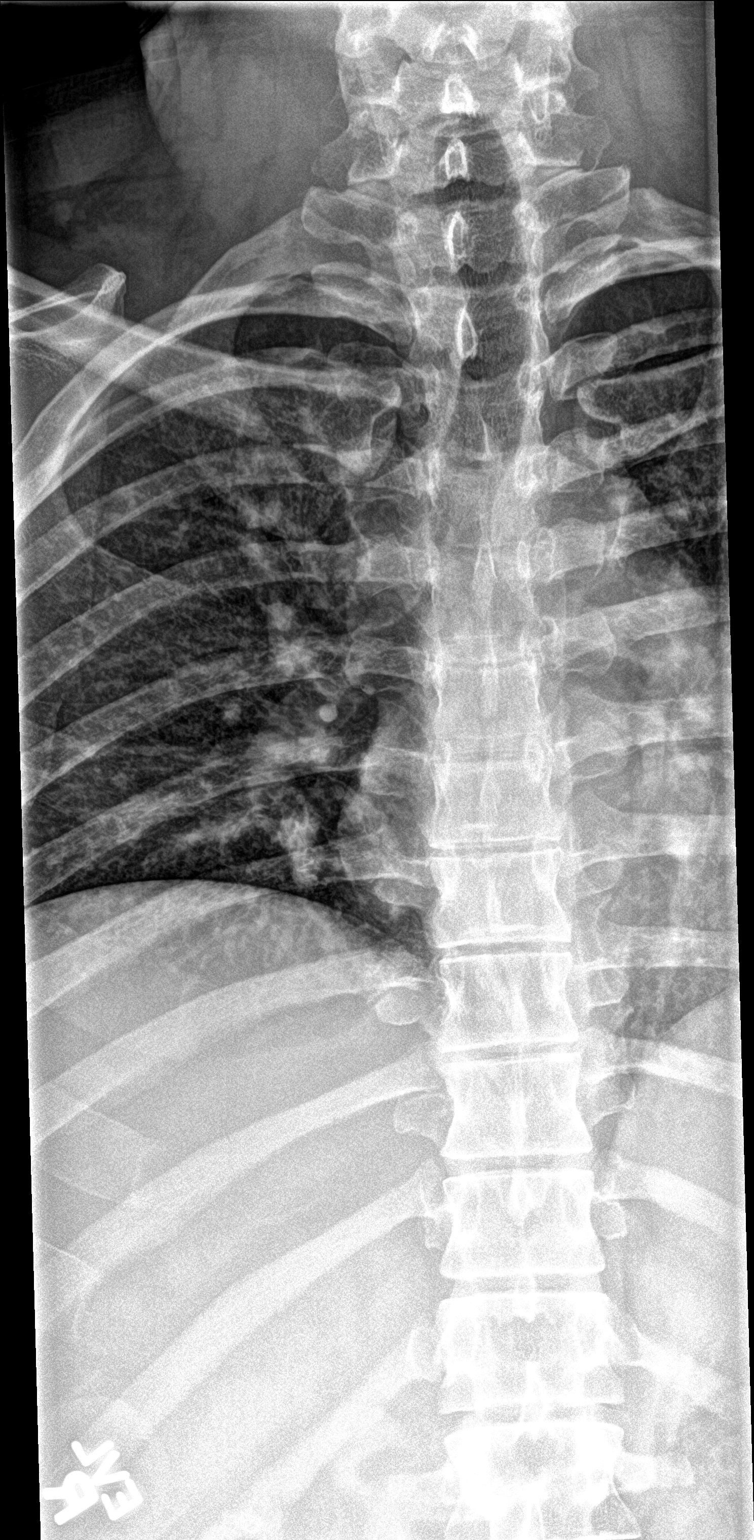

[t-spine lat]
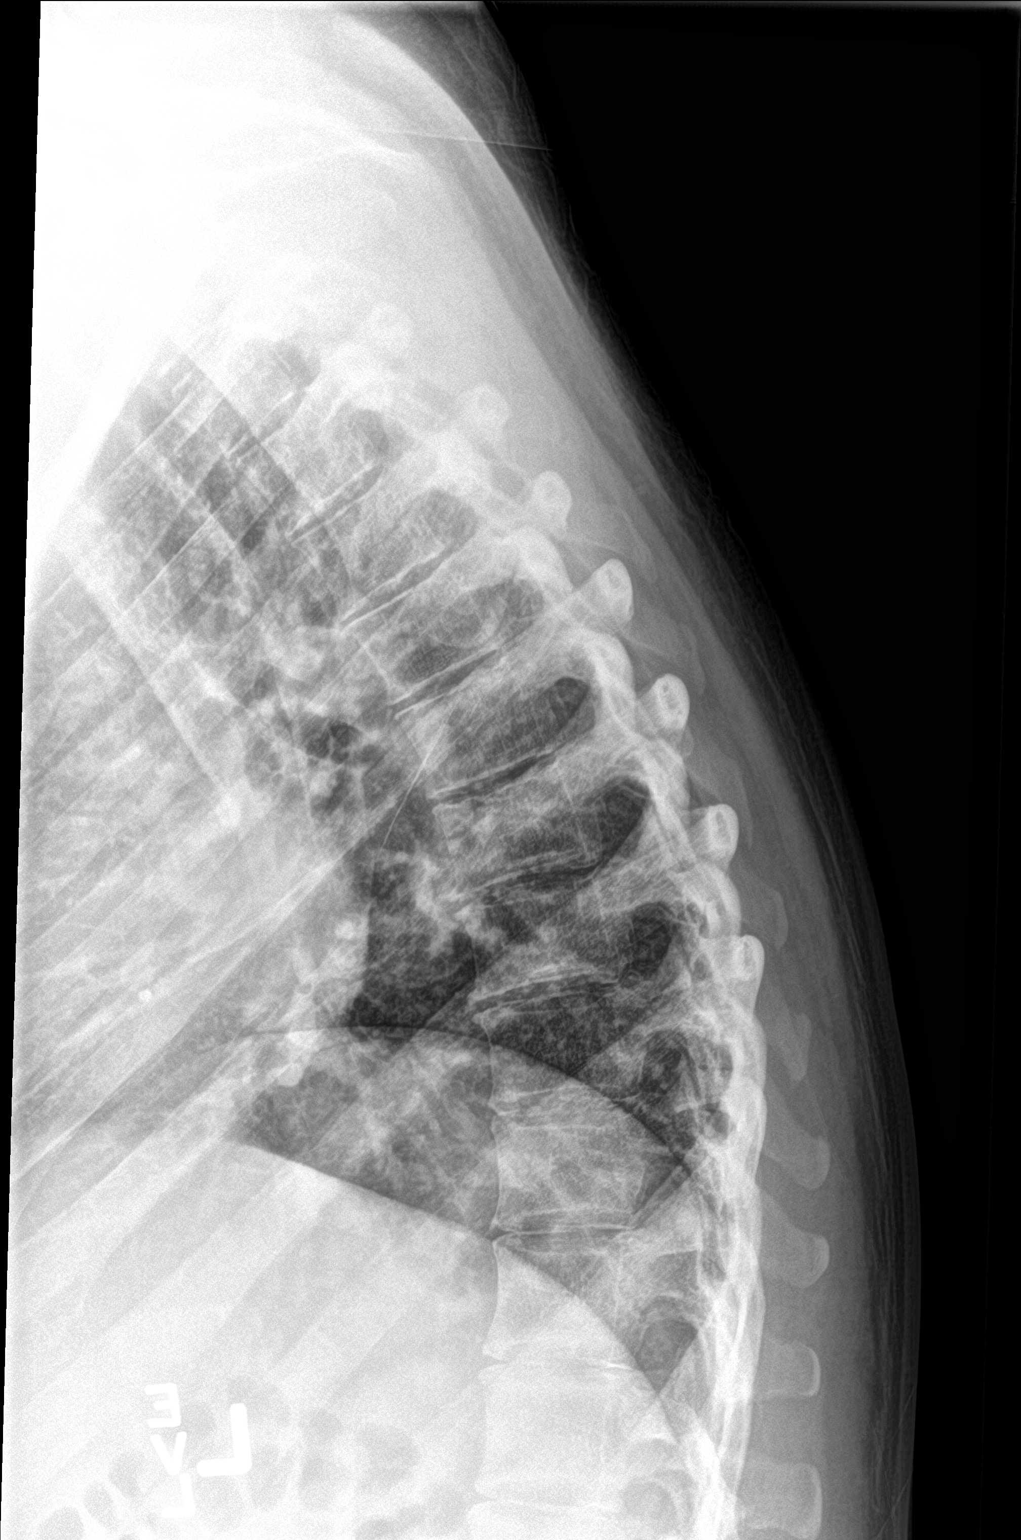

[t-spine swimmers]
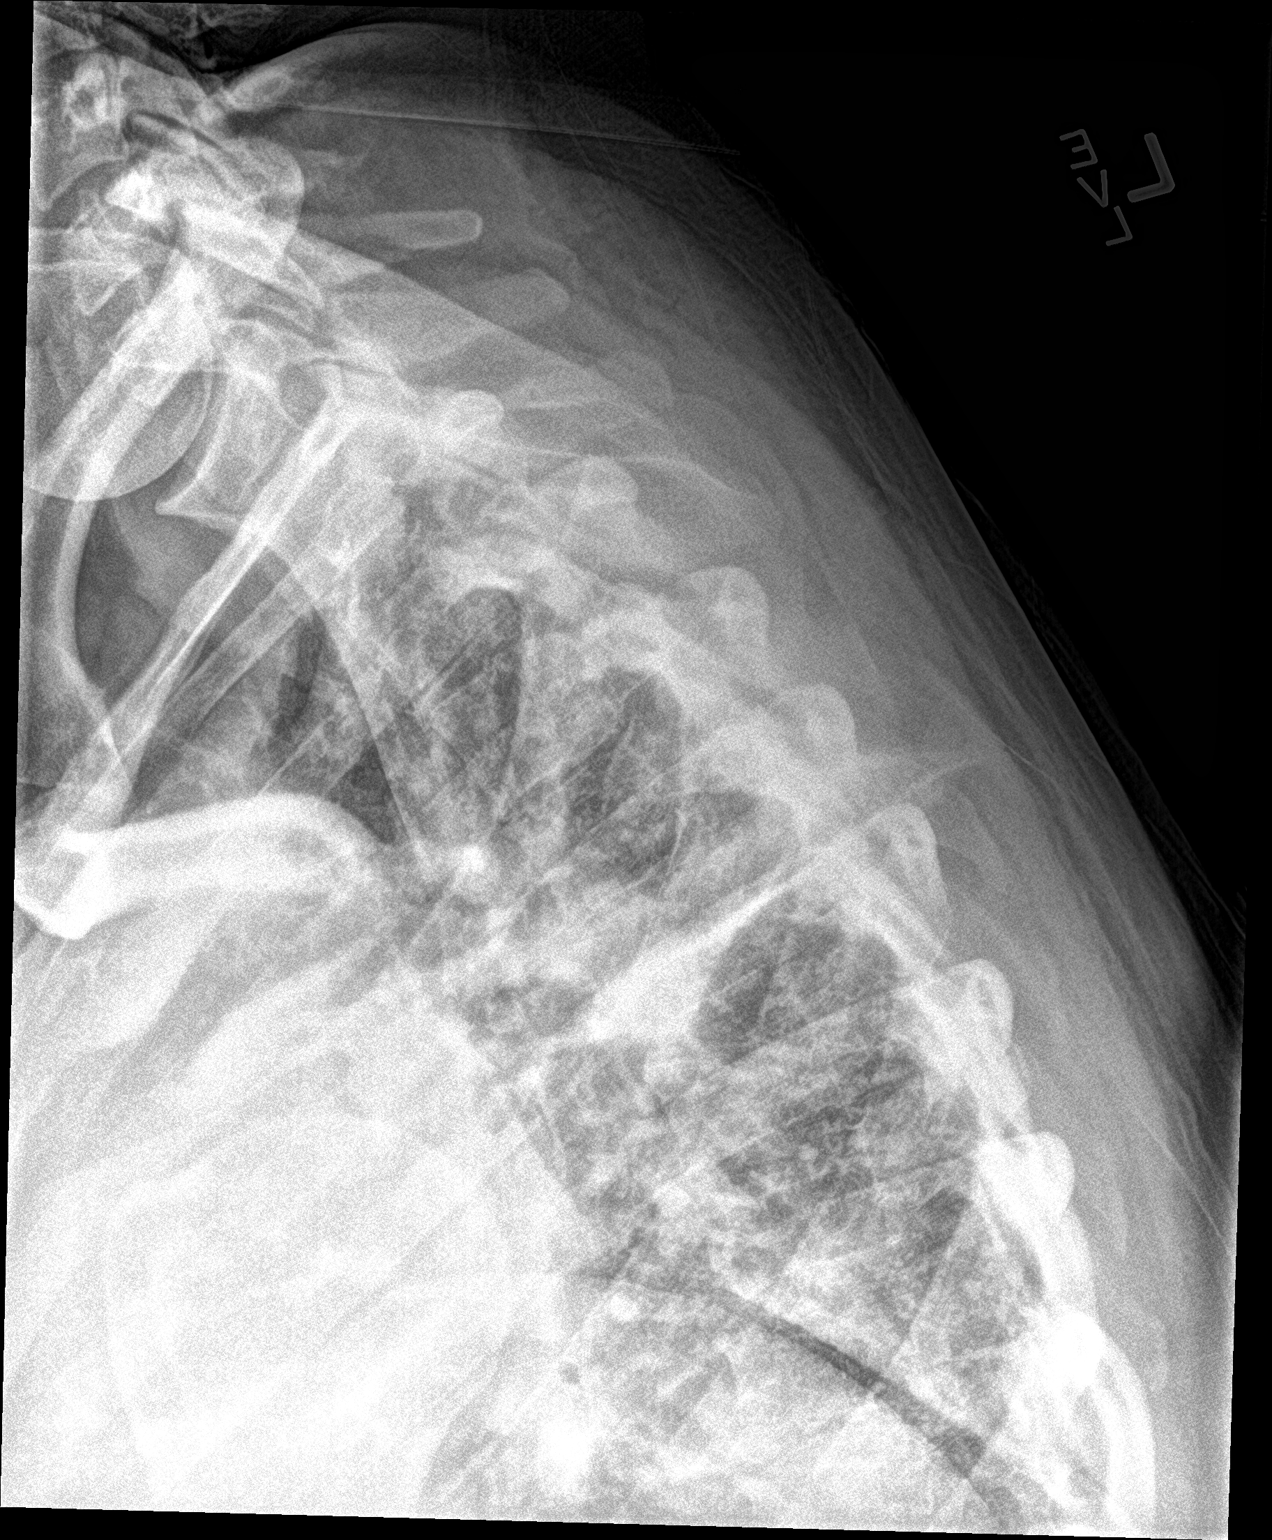

[3 of 3 positions shown; findings below may reference images not displayed]

FINDINGS: There is no evidence of thoracic spine fracture. Alignment is
normal. No other significant bone abnormalities are identified.
IMPRESSION: Negative thoracic spine radiographs.

## 2019-07-17 IMAGING — DX LUMBAR SPINE - COMPLETE 4+ VIEW
5 series · 5 of 5 positions shown · non-contrast
Comparison: 02/25/2015

CLINICAL DATA: Pain.

EXAM:
LUMBAR SPINE - COMPLETE 4+ VIEW

[l-spine ap]
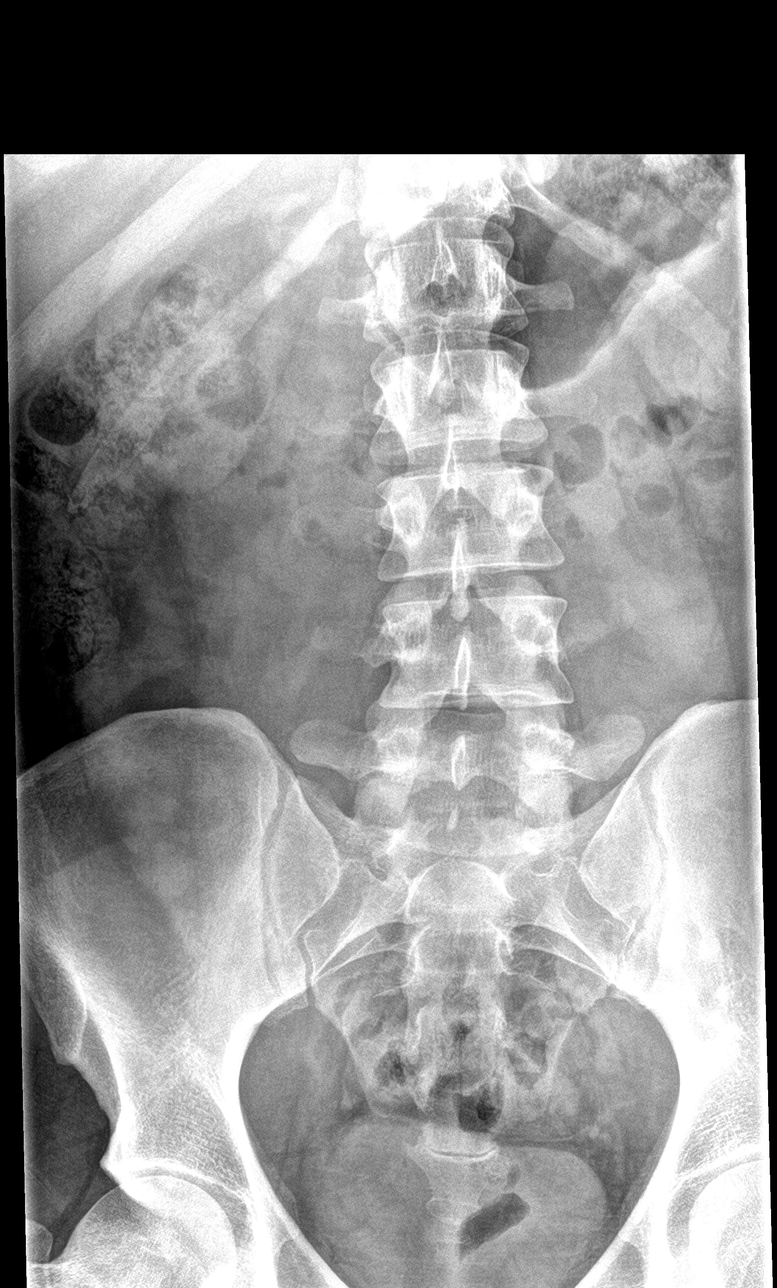

[l-spine obl (1 of 2)]
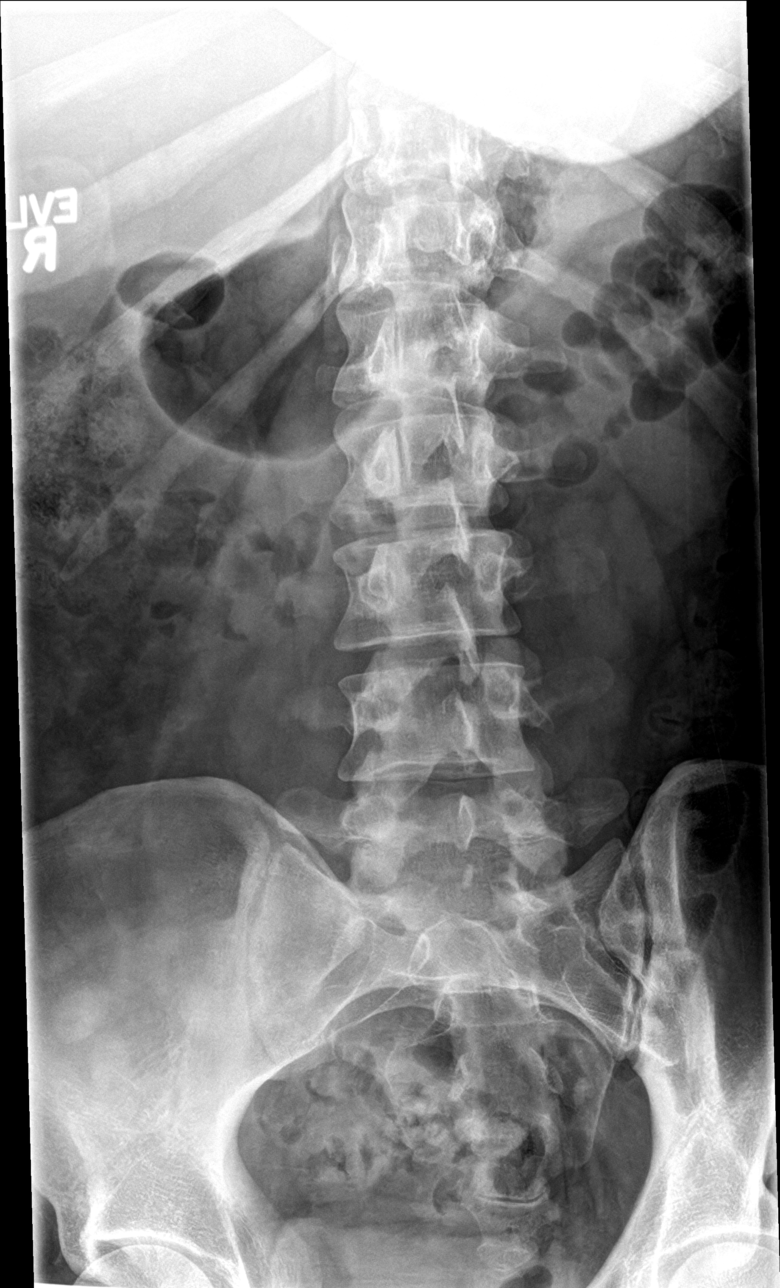

[l-spine obl (2 of 2)]
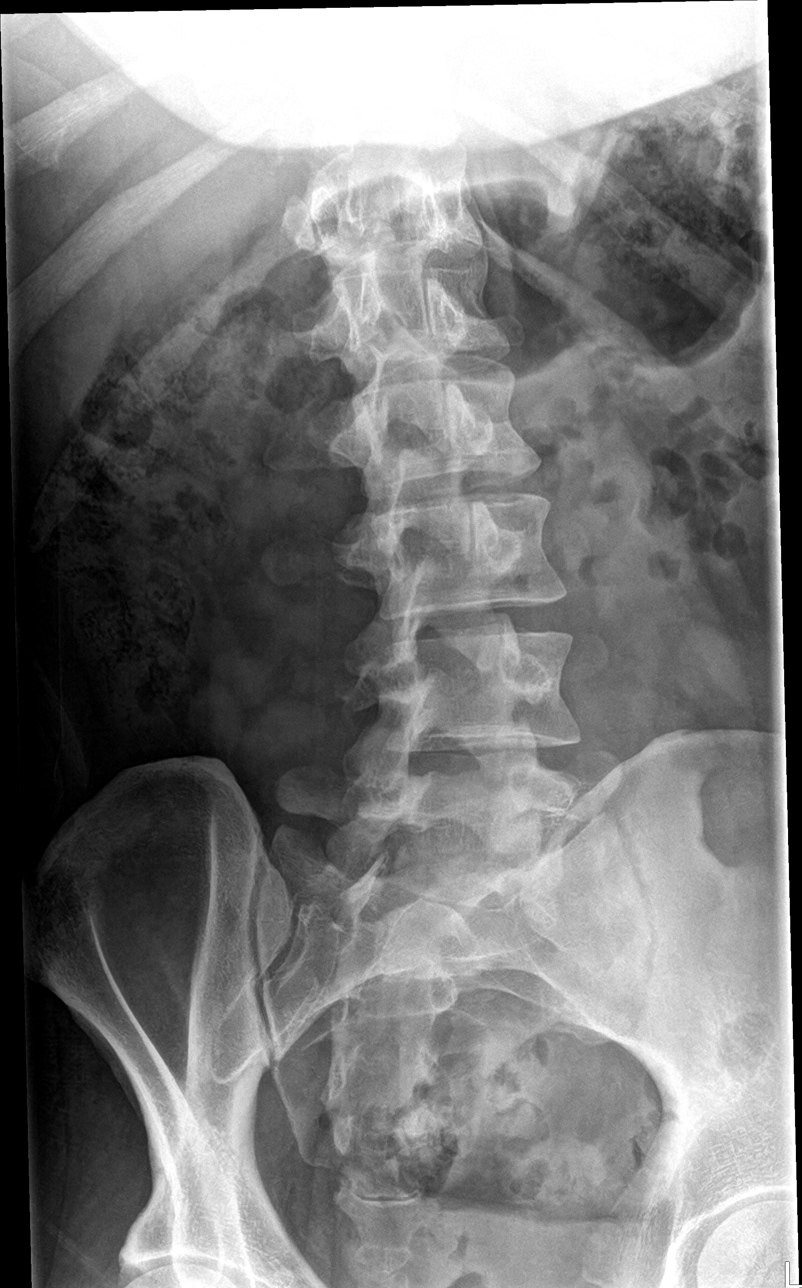

[l-spine lat]
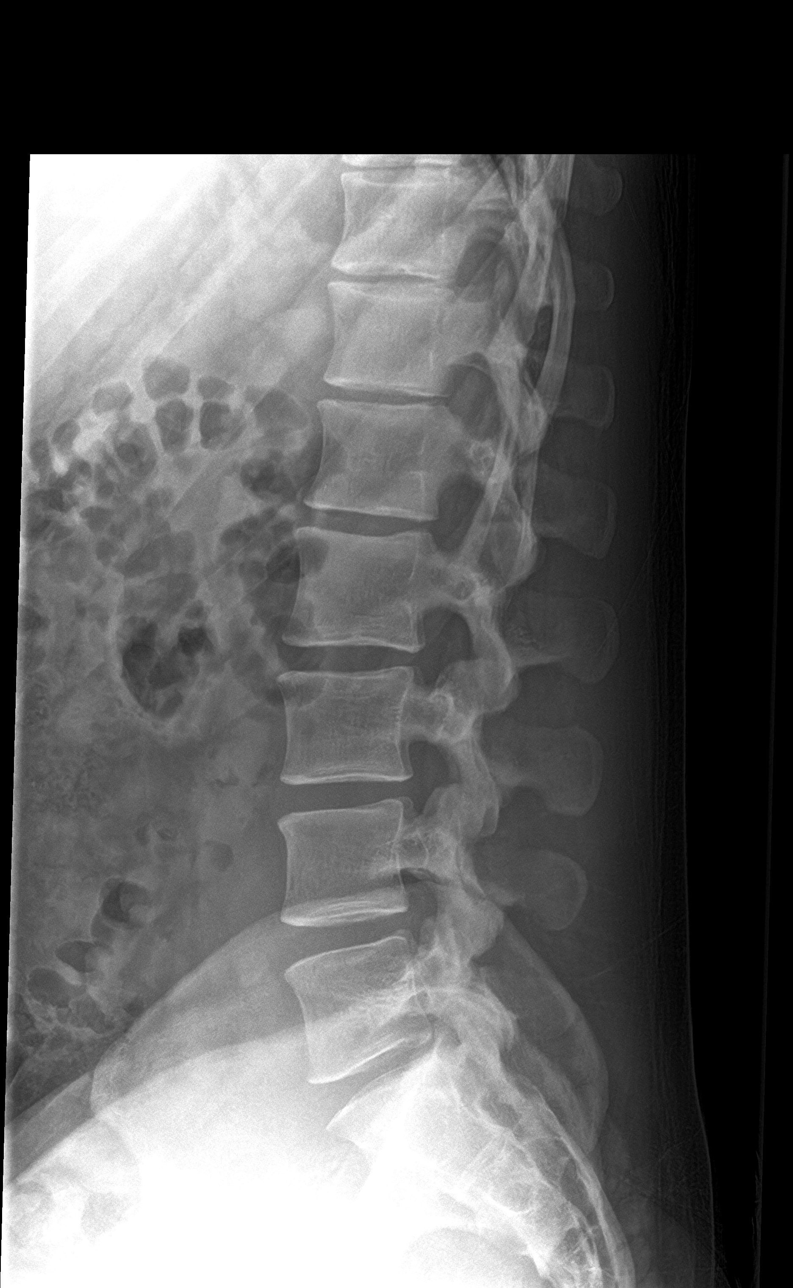

[l-spine spot]
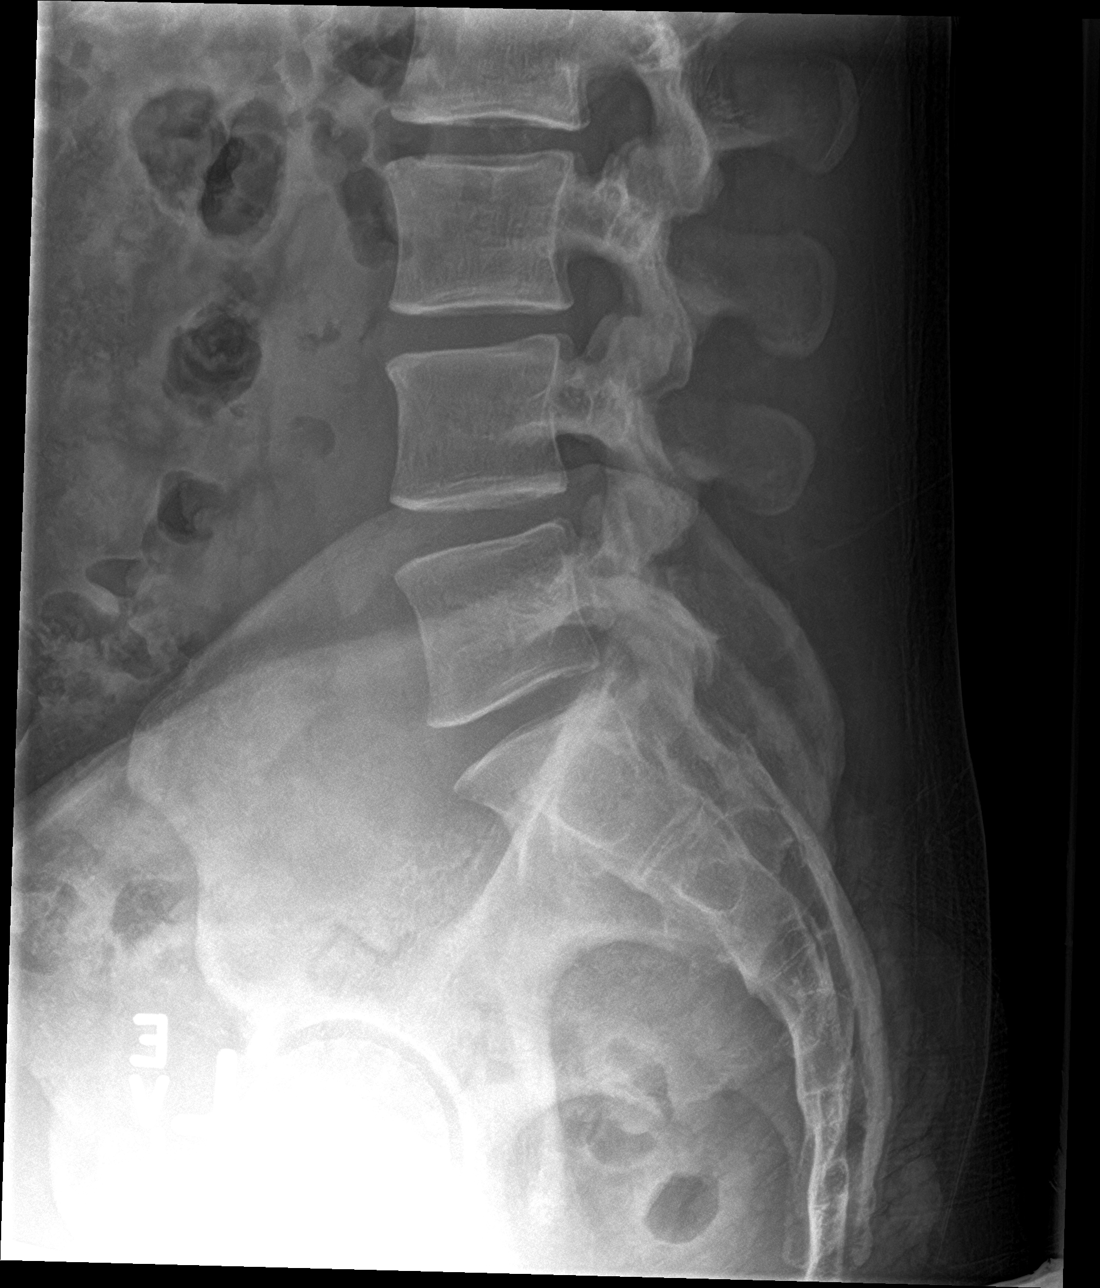

[5 of 5 positions shown; findings below may reference images not displayed]

FINDINGS: There is no evidence of lumbar spine fracture. Alignment is normal.
Intervertebral disc spaces are maintained.
IMPRESSION: Negative.

## 2019-07-21 ENCOUNTER — Ambulatory Visit: Payer: Medicaid Other | Admitting: Family Medicine

## 2019-07-21 ENCOUNTER — Encounter: Payer: Self-pay | Admitting: Family Medicine

## 2019-07-21 ENCOUNTER — Other Ambulatory Visit: Payer: Self-pay

## 2019-07-21 VITALS — BP 115/77 | HR 71 | Ht 71.0 in | Wt 198.0 lb

## 2019-07-21 DIAGNOSIS — M25542 Pain in joints of left hand: Secondary | ICD-10-CM

## 2019-07-21 NOTE — Progress Notes (Signed)
Fred Santos - 29 y.o. male MRN [id number]  Date of birth: Aug 06, 1990  SUBJECTIVE:  Including CC & ROS.  Chief Complaint  Patient presents with  . Follow-up    left wrist    Fred Santos is a 29 y.o. male that is following up for his left wrist and hand pain.  The prednisone seemed to resolve his wrist pain but his hand pain is still occurring.  It is occurring in the dorsal aspect of the hand.  He has some swelling in this area as opposed to the contralateral side.  He feels like the pain is on a more constant basis.  He is unsure if the prednisone helped with this at all.   Review of Systems See HPI   HISTORY: Past Medical, Surgical, Social, and Family History Reviewed & Updated per EMR.   Pertinent Historical Findings include:  Past Medical History:  Diagnosis Date  . Allergy   . Anxiety   . GERD (gastroesophageal reflux disease)   . Insomnia     Past Surgical History:  Procedure Laterality Date  . APPENDECTOMY      Family History  Problem Relation Age of Onset  . Healthy Mother   . Hypertension Mother   . Diabetes Mother   . Healthy Father     Social History   Socioeconomic History  . Marital status: Single    Spouse name: Not on file  . Number of children: Not on file  . Years of education: Not on file  . Highest education level: Not on file  Occupational History  . Not on file  Tobacco Use  . Smoking status: Former Games developer  . Smokeless tobacco: Never Used  Substance and Sexual Activity  . Alcohol use: Not Currently    Comment: denies current use  . Drug use: Yes    Types: Marijuana    Comment: more than a week  . Sexual activity: Yes    Partners: Female  Other Topics Concern  . Not on file  Social History Narrative  . Not on file   Social Determinants of Health   Financial Resource Strain:   . Difficulty of Paying Living Expenses:   Food Insecurity:   . Worried About Programme researcher, broadcasting/film/video in the Last Year:   . Barista in the Last Year:     Transportation Needs:   . Freight forwarder (Medical):   Marland Kitchen Lack of Transportation (Non-Medical):   Physical Activity:   . Days of Exercise per Week:   . Minutes of Exercise per Session:   Stress:   . Feeling of Stress :   Social Connections:   . Frequency of Communication with Friends and Family:   . Frequency of Social Gatherings with Friends and Family:   . Attends Religious Services:   . Active Member of Clubs or Organizations:   . Attends Banker Meetings:   Marland Kitchen Marital Status:   Intimate Partner Violence:   . Fear of Current or Ex-Partner:   . Emotionally Abused:   Marland Kitchen Physically Abused:   . Sexually Abused:      PHYSICAL EXAM:  VS: BP 115/77   Pulse 71   Ht 5\' 11"  (1.803 m)   Wt 198 lb (89.8 kg)   BMI 27.62 kg/m  Physical Exam Gen: NAD, alert, cooperative with exam, well-appearing MSK:  Left hand: Swelling in the dorsal aspect of the hand between the interdigit space of the second third and fourth metacarpals. No  specific redness of the joints. Normal flexion extension of the fingers. Normal wrist range of motion. Neurovascularly intact     ASSESSMENT & PLAN:   Arthralgia of left hand His wrist pain has improved but his hand symptoms are still ongoing.  Unclear if this is has a rheumatologic process.  Denies any radicular symptoms but could be nerve related. -Counseled on supportive care. -ANA, sed rate, CRP. -Could consider nerve study or MRI if symptoms are ongoing and lab work is unrevealing.

## 2019-07-21 NOTE — Assessment & Plan Note (Signed)
His wrist pain has improved but his hand symptoms are still ongoing.  Unclear if this is has a rheumatologic process.  Denies any radicular symptoms but could be nerve related. -Counseled on supportive care. -ANA, sed rate, CRP. -Could consider nerve study or MRI if symptoms are ongoing and lab work is unrevealing.

## 2019-07-21 NOTE — Patient Instructions (Signed)
Good to see you Please use ice as needed  I will call with the results from today   Please send me a message in MyChart with any questions or updates.  Follow up will depend on lab results and next steps.   --Dr. Jordan Likes

## 2019-07-23 LAB — ANA,IFA RA DIAG PNL W/RFLX TIT/PATN
ANA Titer 1: NEGATIVE
Cyclic Citrullin Peptide Ab: 5 units (ref 0–19)
Rheumatoid fact SerPl-aCnc: 10.9 IU/mL (ref 0.0–13.9)

## 2019-07-23 LAB — C-REACTIVE PROTEIN: CRP: 2 mg/L (ref 0–10)

## 2019-07-23 LAB — SEDIMENTATION RATE: Sed Rate: 19 mm/hr — ABNORMAL HIGH (ref 0–15)

## 2019-07-25 ENCOUNTER — Other Ambulatory Visit: Payer: Self-pay | Admitting: Family Medicine

## 2019-07-25 ENCOUNTER — Telehealth: Payer: Self-pay | Admitting: Family Medicine

## 2019-07-25 DIAGNOSIS — M25542 Pain in joints of left hand: Secondary | ICD-10-CM

## 2019-07-25 NOTE — Telephone Encounter (Signed)
Spoke to patient and gave result information as provided by the physician. 

## 2019-07-25 NOTE — Progress Notes (Signed)
Placed order for nerve conduction test.   Myra Rude, MD [facility] 07/25/2019, 1:34 PM

## 2019-07-25 NOTE — Telephone Encounter (Signed)
Left VM for patient. If he calls back please have him speak with a nurse/CMA and inform that his sedimentation rate was slightly elevated.  This is nonspecific for inflammatory causes been going on for a longer period of time.  All his other labs were normal.  We could consider a nerve study to evaluate the function of his nerves in his arm.  This will tell us the health of the nerve through the arm as well as up to the neck..   If any questions then please take the best time and phone number to call and I will try to call him back.   Myra Rude, MD [facility] 07/25/2019, 8:38 AM

## 2019-07-25 NOTE — Telephone Encounter (Signed)
Patient returning call for lab results. 

## 2019-08-13 ENCOUNTER — Other Ambulatory Visit: Payer: Self-pay

## 2019-08-13 ENCOUNTER — Emergency Department (HOSPITAL_BASED_OUTPATIENT_CLINIC_OR_DEPARTMENT_OTHER)
Admission: EM | Admit: 2019-08-13 | Discharge: 2019-08-13 | Disposition: A | Discharge status: Home / Self Care | Payer: Medicaid Other | Attending: Emergency Medicine | Admitting: Emergency Medicine

## 2019-08-13 ENCOUNTER — Encounter (HOSPITAL_BASED_OUTPATIENT_CLINIC_OR_DEPARTMENT_OTHER): Payer: Self-pay | Admitting: Emergency Medicine

## 2019-08-13 DIAGNOSIS — S0121XA Laceration without foreign body of nose, initial encounter: Secondary | ICD-10-CM | POA: Diagnosis not present

## 2019-08-13 DIAGNOSIS — S0993XA Unspecified injury of face, initial encounter: Secondary | ICD-10-CM | POA: Diagnosis present

## 2019-08-13 DIAGNOSIS — S01112A Laceration without foreign body of left eyelid and periocular area, initial encounter: Secondary | ICD-10-CM | POA: Insufficient documentation

## 2019-08-13 DIAGNOSIS — Y999 Unspecified external cause status: Secondary | ICD-10-CM | POA: Diagnosis not present

## 2019-08-13 DIAGNOSIS — Y929 Unspecified place or not applicable: Secondary | ICD-10-CM | POA: Diagnosis not present

## 2019-08-13 DIAGNOSIS — Z87891 Personal history of nicotine dependence: Secondary | ICD-10-CM | POA: Diagnosis not present

## 2019-08-13 DIAGNOSIS — Z7721 Contact with and (suspected) exposure to potentially hazardous body fluids: Secondary | ICD-10-CM | POA: Insufficient documentation

## 2019-08-13 DIAGNOSIS — Y939 Activity, unspecified: Secondary | ICD-10-CM | POA: Diagnosis not present

## 2019-08-13 LAB — RAPID HIV SCREEN (HIV 1/2 AB+AG)
HIV 1/2 Antibodies: NONREACTIVE
HIV-1 P24 Antigen - HIV24: NONREACTIVE
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
## 2019-08-13 NOTE — Discharge Instructions (Signed)
As discussed, you had HIV, hepatitis B, and hepatitis C labs drawn today.  We will find out your results in the next few days.  I have included the number for [facility] if you like to follow-up for repeat labs.  Return to the ER for new or worsening symptoms.
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
## 2019-08-14 LAB — HEPATITIS B SURFACE ANTIGEN: Hepatitis B Surface Ag: NONREACTIVE

## 2019-08-18 LAB — HCV INTERPRETATION

## 2019-08-18 LAB — HCV AB W REFLEX TO QUANT PCR: HCV Ab: <0.1 s/co ratio (ref 0.0–0.9)

## 2019-09-01 ENCOUNTER — Ambulatory Visit (INDEPENDENT_AMBULATORY_CARE_PROVIDER_SITE_OTHER): Payer: Medicaid Other | Admitting: Diagnostic Neuroimaging

## 2019-09-01 ENCOUNTER — Other Ambulatory Visit: Payer: Self-pay

## 2019-09-01 ENCOUNTER — Telehealth: Payer: Self-pay | Admitting: *Deleted

## 2019-09-01 ENCOUNTER — Encounter (INDEPENDENT_AMBULATORY_CARE_PROVIDER_SITE_OTHER): Payer: Medicaid Other | Admitting: Diagnostic Neuroimaging

## 2019-09-01 DIAGNOSIS — Z0289 Encounter for other administrative examinations: Secondary | ICD-10-CM

## 2019-09-01 DIAGNOSIS — M79642 Pain in left hand: Secondary | ICD-10-CM

## 2019-09-01 NOTE — Procedures (Signed)
   [facility]  NCS (NERVE CONDUCTION STUDY) WITH EMG (ELECTROMYOGRAPHY) REPORT   STUDY DATE: 09/01/19 PATIENT NAME: Fred Santos DOB: 06/19/1990 MRN: [id number]  ORDERING CLINICIAN: Daphene Jaeger  TECHNOLOGIST: Durenda Age ELECTROMYOGRAPHER: Glenford Bayley. Shevelle Smither, MD  CLINICAL INFORMATION: 29 year old male with left arm pain.  FINDINGS: NERVE CONDUCTION STUDY: Left median and ulnar motor responses are normal.  Left median and ulnar sensory responses are normal.  Left ulnar F wave latency is normal.   NEEDLE ELECTROMYOGRAPHY:  Needle examination of left upper extremity is normal.   IMPRESSION:   This is a normal study.  No electrodiagnostic evidence of large fiber neuropathy or myopathy at this time.    INTERPRETING PHYSICIAN:  Suanne Marker, MD Certified in Neurology, Neurophysiology and Neuroimaging  [facility] [address], [address] [place], [place] [address] [phone]   [facility]    Nerve / Sites Muscle Latency Ref. Amplitude Ref. Rel Amp Segments Distance Velocity Ref. Area    ms ms mV mV %  cm m/s m/s mVms  L Median - APB     Wrist APB 3.5 ?4.4 8.0 ?4.0 100 Wrist - APB 7   29.1     Upper arm APB 7.0  10.0  125 Upper arm - Wrist 23 64 ?49 43.1  L Ulnar - ADM     Wrist ADM 2.4 ?3.3 13.9 ?6.0 100 Wrist - ADM 7   40.3     B.Elbow ADM 5.8  12.7  91.3 B.Elbow - Wrist 22 65 ?49 39.0     A.Elbow ADM 7.2  12.1  95.3 A.Elbow - B.Elbow 10 68 ?49 38.1         SNC    Nerve / Sites Rec. Site Peak Lat Ref.  Amp Ref. Segments Distance    ms ms V V  cm  L Median - Orthodromic (Dig II, Mid palm)     Dig II Wrist 2.9 ?3.4 14 ?10 Dig II - Wrist 13  L Ulnar - Orthodromic, (Dig V, Mid palm)     Dig V Wrist 2.4 ?3.1 11 ?5 Dig V - Wrist 48         F  Wave    Nerve F Lat Ref.   ms ms  L Ulnar - ADM 25.9 ?32.0       EMG Summary Table    Spontaneous MUAP Recruitment  Muscle IA Fib PSW Fasc Other Amp Dur. Poly Pattern  L. Deltoid  Normal None None None _______ Normal Normal Normal Normal  L. Biceps brachii Normal None None None _______ Normal Normal Normal Normal  L. Triceps brachii Normal None None None _______ Normal Normal Normal Normal  L. Flexor carpi radialis Normal None None None _______ Normal Normal Normal Normal  L. First dorsal interosseous Normal None None None _______ Normal Normal Normal Normal

## 2019-09-01 NOTE — Telephone Encounter (Signed)
Received call from patient who  is coming for EMG/NCS today and asked if he needs a driver. I advised he does not. Patient verbalized understanding, appreciation.

## 2019-09-04 ENCOUNTER — Encounter (HOSPITAL_BASED_OUTPATIENT_CLINIC_OR_DEPARTMENT_OTHER): Payer: Self-pay | Admitting: Emergency Medicine

## 2019-09-04 ENCOUNTER — Other Ambulatory Visit: Payer: Self-pay

## 2019-09-04 ENCOUNTER — Emergency Department (HOSPITAL_BASED_OUTPATIENT_CLINIC_OR_DEPARTMENT_OTHER)
Admission: EM | Admit: 2019-09-04 | Discharge: 2019-09-05 | Disposition: A | Discharge status: Home / Self Care | Payer: Medicaid Other | Attending: Emergency Medicine | Admitting: Emergency Medicine

## 2019-09-04 DIAGNOSIS — Z87891 Personal history of nicotine dependence: Secondary | ICD-10-CM | POA: Diagnosis not present

## 2019-09-04 DIAGNOSIS — Z79899 Other long term (current) drug therapy: Secondary | ICD-10-CM | POA: Insufficient documentation

## 2019-09-04 DIAGNOSIS — F1210 Cannabis abuse, uncomplicated: Secondary | ICD-10-CM | POA: Insufficient documentation

## 2019-09-04 DIAGNOSIS — R21 Rash and other nonspecific skin eruption: Secondary | ICD-10-CM | POA: Diagnosis present

## 2019-09-04 NOTE — ED Triage Notes (Signed)
Pt presents with vesicular rash to dorsum of R middle finger that started ~ 6 days ago. Denies rash anywhere else or other sx. States rash is itchy.

## 2019-09-05 ENCOUNTER — Encounter (HOSPITAL_BASED_OUTPATIENT_CLINIC_OR_DEPARTMENT_OTHER): Payer: Self-pay | Admitting: Emergency Medicine

## 2019-09-05 MED ORDER — MUPIROCIN CALCIUM 2 % EX CREA: 1.0000 "application " | TOPICAL_CREAM | Freq: Two times a day (BID) | CUTANEOUS | 0 refills | Status: AC

## 2019-09-05 NOTE — ED Provider Notes (Signed)
[facility] EMERGENCY DEPARTMENT Provider Note   CSN: [id number] Arrival date & time: 09/04/19  2157     History Chief Complaint  Patient presents with  . Rash    Fred Santos is a 29 y.o. male.  The history is provided by the patient.  Rash Location: dorsum of right middle finger. Quality: itchiness   Severity:  Mild Onset quality:  Gradual Duration:  1 week Timing:  Constant Progression:  Unchanged Chronicity:  New Context: not animal contact, not chemical exposure, not diapers, not eggs, not exposure to similar rash and not food   Relieved by:  Nothing Worsened by:  Nothing Ineffective treatments:  None tried Associated symptoms: no abdominal pain, no fever, no induration, no joint pain and no shortness of breath        Past Medical History:  Diagnosis Date  . Allergy   . Anxiety   . GERD (gastroesophageal reflux disease)   . Insomnia     Patient Active Problem List   Diagnosis Date Noted  . Arthralgia of left hand 06/30/2019  . GAD (generalized anxiety disorder) 08/06/2017  . Gastroesophageal reflux disease 06/17/2017  . Sleep disorder 06/17/2017    Past Surgical History:  Procedure Laterality Date  . APPENDECTOMY         Family History  Problem Relation Age of Onset  . Healthy Mother   . Hypertension Mother   . Diabetes Mother   . Healthy Father     Social History   Tobacco Use  . Smoking status: Former Games developer  . Smokeless tobacco: Never Used  Substance Use Topics  . Alcohol use: Not Currently    Comment: denies current use  . Drug use: Yes    Types: Marijuana    Comment: more than a week    Home Medications Prior to Admission medications   Medication Sig Start Date End Date Taking? Authorizing Provider  esomeprazole (NEXIUM) 20 MG capsule Take 20 mg by mouth daily at 12 noon.    [provider]  hydrOXYzine (ATARAX/VISTARIL) 25 MG tablet Take 1 tablet (25 mg total) by mouth every 6 (six) hours as needed for  anxiety. 05/27/19   Lorelee New, PA-C  ibuprofen (ADVIL) 600 MG tablet Take 1 tablet (600 mg total) by mouth every 6 (six) hours as needed. 06/28/19   Elson Areas, PA-C  ibuprofen (ADVIL) 800 MG tablet Take 1 tablet (800 mg total) by mouth every 8 (eight) hours as needed. 06/28/19   Elson Areas, PA-C  meloxicam (MOBIC) 7.5 MG tablet  10/11/18   [provider]  mirtazapine (REMERON) 30 MG tablet Take 30 mg by mouth at bedtime.    [provider]  omeprazole (PRILOSEC) 20 MG capsule Take 1 capsule (20 mg total) by mouth daily. 09/17/17   Sharlene Dory, DO  ondansetron (ZOFRAN ODT) 4 MG disintegrating tablet Take 1 tablet (4 mg total) by mouth every 8 (eight) hours as needed for nausea or vomiting. 05/27/19   Lorelee New, PA-C  predniSONE (DELTASONE) 5 MG tablet Take 6 pills for first day, 5 pills second day, 4 pills third day, 3 pills fourth day, 2 pills the fifth day, and 1 pill sixth day. 06/30/19   Myra Rude, MD    Allergies    Patient has no known allergies.  Review of Systems   Review of Systems  Constitutional: Negative for fever.  HENT: Negative for congestion.   Eyes: Negative for visual disturbance.  Respiratory:  Negative for shortness of breath.   Cardiovascular: Negative for chest pain.  Gastrointestinal: Negative for abdominal pain.  Genitourinary: Negative for difficulty urinating.  Musculoskeletal: Negative for arthralgias.  Skin: Positive for rash.  Neurological: Negative for dizziness.  Psychiatric/Behavioral: Negative for agitation.  All other systems reviewed and are negative.   Physical Exam Updated Vital Signs BP (!) 133/93 (BP Location: Right Arm)   Pulse 76   Temp 98.6 F (37 C) (Oral)   Resp 18   Ht 5\' 11"  (1.803 m)   Wt 89.8 kg   SpO2 98%   BMI 27.62 kg/m   Physical Exam Vitals and nursing note reviewed.  Constitutional:      General: He is not in acute distress.    Appearance: Normal appearance.  HENT:       Head: Normocephalic and atraumatic.     Nose: Nose normal.  Eyes:     Conjunctiva/sclera: Conjunctivae normal.     Pupils: Pupils are equal, round, and reactive to light.  Cardiovascular:     Rate and Rhythm: Normal rate and regular rhythm.     Pulses: Normal pulses.     Heart sounds: Normal heart sounds.  Pulmonary:     Effort: Pulmonary effort is normal.     Breath sounds: Normal breath sounds.  Abdominal:     General: Abdomen is flat.     Tenderness: There is no abdominal tenderness.  Musculoskeletal:        General: Normal range of motion.     Cervical back: Normal range of motion and neck supple.     Comments: Honey colored crust between DIP and PIP dorsum of right middle finger  Skin:    General: Skin is warm and dry.     Capillary Refill: Capillary refill takes less than 2 seconds.  Neurological:     General: No focal deficit present.     Mental Status: He is alert and oriented to person, place, and time.  Psychiatric:        Mood and Affect: Mood normal.        Behavior: Behavior normal.     ED Results / Procedures / Treatments   Labs (all labs ordered are listed, but only abnormal results are displayed) Labs Reviewed - No data to display  EKG None  Radiology No results found.  Procedures Procedures (including critical care time)  Medications Ordered in ED Medications - No data to display  ED Course  I have reviewed the triage vital signs and the nursing notes.  Pertinent labs & imaging results that were available during my care of the patient were reviewed by me and considered in my medical decision making (see chart for details).    will treat with bactroban given honey colored crusting.  I believe this was likely poison ivy that has become infected but has not spread.    Fred Santos was evaluated in Emergency Department on 09/05/2019 for the symptoms described in the history of present illness. He was evaluated in the context of the global  COVID-19 pandemic, which necessitated consideration that the patient might be at risk for infection with the SARS-CoV-2 virus that causes COVID-19. Institutional protocols and algorithms that pertain to the evaluation of patients at risk for COVID-19 are in a state of rapid change based on information released by regulatory bodies including the CDC and federal and state organizations. These policies and algorithms were followed during the patient's care in the ED.  Final Clinical Impression(s) / ED  Diagnoses Return for intractable cough, coughing up blood,fevers >100.4 unrelieved by medication, shortness of breath, intractable vomiting, chest pain, shortness of breath, weakness,numbness, changes in speech, facial asymmetry,abdominal pain, passing out,Inability to tolerate liquids or food, cough, altered mental status or any concerns. No signs of systemic illness or infection. The patient is nontoxic-appearing on exam and vital signs are within normal limits.   I have reviewed the triage vital signs and the nursing notes. Pertinent labs &imaging results that were available during my care of the patient were reviewed by me and considered in my medical decision making (see chart for details).After history, exam, and medical workup I feel the patient has beenappropriately medically screened and is safe for discharge home. Pertinent diagnoses were discussed with the patient. Patient was given return precautions.   Fred Selman, MD 09/05/19 [id number]

## 2019-09-14 ENCOUNTER — Other Ambulatory Visit: Payer: Self-pay

## 2019-09-14 ENCOUNTER — Telehealth (INDEPENDENT_AMBULATORY_CARE_PROVIDER_SITE_OTHER): Payer: Medicaid Other | Admitting: Family Medicine

## 2019-09-14 DIAGNOSIS — M25542 Pain in joints of left hand: Secondary | ICD-10-CM | POA: Diagnosis not present

## 2019-09-14 NOTE — Progress Notes (Signed)
Virtual Visit via Telephone Note  I connected with Houston Siren on 09/14/19 at  9:10 AM EDT by telephone and verified that I am speaking with the correct person using two identifiers.   I discussed the limitations, risks, security and privacy concerns of performing an evaluation and management service by telephone and the availability of in person appointments. I also discussed with the patient that there may be a patient responsible charge related to this service. The patient expressed understanding and agreed to proceed.  Patient: home  Physician: office   History of Present Illness:  Mr. Brar is a 29 year old male that is following up after EMG performed on his left side.  The EMG was found to be normal.  He has limited his work with his hands and his symptoms seem to be improving.   Observations/Objective:   Assessment and Plan:  Left hand pain: His symptoms are slowly improving.  He has been less active with his hands.  Recent EMG was found to be normal. -Counseled on supportive care. -Could consider MRI of the hand if ongoing.  Follow Up Instructions:    I discussed the assessment and treatment plan with the patient. The patient was provided an opportunity to ask questions and all were answered. The patient agreed with the plan and demonstrated an understanding of the instructions.   The patient was advised to call back or seek an in-person evaluation if the symptoms worsen or if the condition fails to improve as anticipated.  I provided 5 minutes of non-face-to-face time during this encounter.   Clare Gandy, MD

## 2019-09-14 NOTE — Assessment & Plan Note (Signed)
His symptoms are slowly improving.  He has been less active with his hands.  Recent EMG was found to be normal. -Counseled on supportive care. -Could consider MRI of the hand if ongoing.

## 2020-01-10 ENCOUNTER — Ambulatory Visit
Admission: EM | Admit: 2020-01-10 | Discharge: 2020-01-10 | Disposition: A | Discharge status: Home / Self Care | Payer: Medicaid Other | Attending: Emergency Medicine | Admitting: Emergency Medicine

## 2020-01-10 ENCOUNTER — Other Ambulatory Visit: Payer: Self-pay

## 2020-01-10 DIAGNOSIS — R1032 Left lower quadrant pain: Secondary | ICD-10-CM | POA: Insufficient documentation

## 2020-01-10 DIAGNOSIS — R109 Unspecified abdominal pain: Secondary | ICD-10-CM | POA: Insufficient documentation

## 2020-01-10 DIAGNOSIS — Z1152 Encounter for screening for COVID-19: Secondary | ICD-10-CM | POA: Diagnosis not present

## 2020-01-10 DIAGNOSIS — R1012 Left upper quadrant pain: Secondary | ICD-10-CM | POA: Diagnosis present

## 2020-01-10 LAB — POCT URINALYSIS DIP (MANUAL ENTRY)
Bilirubin, UA: NEGATIVE
Blood, UA: NEGATIVE
Glucose, UA: NEGATIVE mg/dL
Ketones, POC UA: NEGATIVE mg/dL
Leukocytes, UA: NEGATIVE
Nitrite, UA: NEGATIVE
Protein Ur, POC: NEGATIVE mg/dL
Spec Grav, UA: 1.02 (ref 1.010–1.025)
Urobilinogen, UA: 0.2 E.U./dL
pH, UA: 7.5 (ref 5.0–8.0)

## 2020-01-10 MED ORDER — DICYCLOMINE HCL 20 MG PO TABS: 20.0000 mg | ORAL_TABLET | Freq: Two times a day (BID) | ORAL | 0 refills | Status: AC

## 2020-01-10 MED ORDER — ONDANSETRON HCL 4 MG PO TABS
4.0000 mg | ORAL_TABLET | Freq: Four times a day (QID) | ORAL | 0 refills | Status: DC
Start: 2020-01-10 — End: 2020-01-18

## 2020-01-10 MED ORDER — ALUM & MAG HYDROXIDE-SIMETH 200-200-20 MG/5ML PO SUSP
30.0000 mL | Freq: Once | ORAL | Status: AC
  Administered 2020-01-10: 30 mL via ORAL

## 2020-01-10 NOTE — Discharge Instructions (Signed)
Urine without signs of infection or blood, which would suggest kidney stone.   Unable to rule out kidney stones, diverticulitis, hernia, etc... in urgent care setting.  Offered patient further evaluation and management in the ED.  Patient declines at this time and would like to try outpatient therapy first.  Aware of the risk associated with this decision including missed diagnosis, organ damage, organ failure, and/or death.  Patient aware and in agreement.     GI cocktail given in office Get rest and drink fluids Zofran prescribed for nausea.  Take as directed.   Bentyl prescribed.  Take as directed for stomach upset Follow up with PCP for further evaluation and management If you experience new or worsening symptoms return or go to ER such as fever, chills, nausea, vomiting, diarrhea, bloody or dark tarry stools, constipation, urinary symptoms, worsening abdominal discomfort, symptoms that do not improve with medications, inability to keep fluids down, etc..Marland Kitchen

## 2020-01-10 NOTE — ED Triage Notes (Signed)
Pt presents with c/o feeling unwell with some body aches and abdominal pain in left lower quadrant , began feeling bad on Saturday

## 2020-01-10 NOTE — ED Provider Notes (Signed)
[facility]   [id number] 01/10/20 Arrival Time: 5809  CC: ABDOMINAL DISCOMFORT  SUBJECTIVE:  Abdulrahman Bracey is a 29 y.o. male who presents with complaint of abdominal discomfort that began 3 days ago.  Denies a precipitating event, trauma, close contacts with similar symptoms, recent travel or antibiotic use.  Localizes pain to LLQ.  Describes as intermittent, waxes and wanes, and cramping in character.  Has not tried OTC medications.  Worse with eating.  Denies similar symptoms in the past.  Last BM yesterday with hard stools.  Complains chills, nausea, and constipation.  Denies fever, vomiting, chest pain, SOB, diarrhea, hematochezia, melena, dysuria, difficulty urinating, increased frequency or urgency, flank pain, loss of bowel or bladder function.  Also requests covid test  No LMP for male patient.  ROS: As per HPI.  All other pertinent ROS negative.     Past Medical History:  Diagnosis Date  . Allergy   . Anxiety   . GERD (gastroesophageal reflux disease)   . Insomnia    Past Surgical History:  Procedure Laterality Date  . APPENDECTOMY     No Known Allergies No current facility-administered medications on file prior to encounter.   Current Outpatient Medications on File Prior to Encounter  Medication Sig Dispense Refill  . esomeprazole (NEXIUM) 20 MG capsule Take 20 mg by mouth daily at 12 noon.    . hydrOXYzine (ATARAX/VISTARIL) 25 MG tablet Take 1 tablet (25 mg total) by mouth every 6 (six) hours as needed for anxiety. 15 tablet 0  . ibuprofen (ADVIL) 600 MG tablet Take 1 tablet (600 mg total) by mouth every 6 (six) hours as needed. 30 tablet 0  . ibuprofen (ADVIL) 800 MG tablet Take 1 tablet (800 mg total) by mouth every 8 (eight) hours as needed. 30 tablet 0  . meloxicam (MOBIC) 7.5 MG tablet     . mirtazapine (REMERON) 30 MG tablet Take 30 mg by mouth at bedtime.    . mupirocin cream (BACTROBAN) 2 % Apply 1 application topically 2 (two) times daily. 15 g 0   . ondansetron (ZOFRAN ODT) 4 MG disintegrating tablet Take 1 tablet (4 mg total) by mouth every 8 (eight) hours as needed for nausea or vomiting. 20 tablet 0  . [DISCONTINUED] omeprazole (PRILOSEC) 20 MG capsule Take 1 capsule (20 mg total) by mouth daily. 30 capsule 3   Social History   Socioeconomic History  . Marital status: Single    Spouse name: Not on file  . Number of children: Not on file  . Years of education: Not on file  . Highest education level: Not on file  Occupational History  . Not on file  Tobacco Use  . Smoking status: Former Games developer  . Smokeless tobacco: Never Used  Substance and Sexual Activity  . Alcohol use: Not Currently    Comment: denies current use  . Drug use: Yes    Types: Marijuana    Comment: more than a week  . Sexual activity: Yes    Partners: Female  Other Topics Concern  . Not on file  Social History Narrative  . Not on file   Social Determinants of Health   Financial Resource Strain:   . Difficulty of Paying Living Expenses: Not on file  Food Insecurity:   . Worried About Programme researcher, broadcasting/film/video in the Last Year: Not on file  . Ran Out of Food in the Last Year: Not on file  Transportation Needs:   . Lack of Transportation (Medical):  Not on file  . Lack of Transportation (Non-Medical): Not on file  Physical Activity:   . Days of Exercise per Week: Not on file  . Minutes of Exercise per Session: Not on file  Stress:   . Feeling of Stress : Not on file  Social Connections:   . Frequency of Communication with Friends and Family: Not on file  . Frequency of Social Gatherings with Friends and Family: Not on file  . Attends Religious Services: Not on file  . Active Member of Clubs or Organizations: Not on file  . Attends Banker Meetings: Not on file  . Marital Status: Not on file  Intimate Partner Violence:   . Fear of Current or Ex-Partner: Not on file  . Emotionally Abused: Not on file  . Physically Abused: Not on file    . Sexually Abused: Not on file   Family History  Problem Relation Age of Onset  . Healthy Mother   . Hypertension Mother   . Diabetes Mother   . Healthy Father      OBJECTIVE:  Vitals:   01/10/20 0944  BP: 127/88  Pulse: 81  Resp: 18  Temp: 98.7 F (37.1 C)  SpO2: 96%    General appearance: Alert; appears uncomfortable HEENT: NCAT.  Oropharynx clear.  Lungs: clear to auscultation bilaterally without adventitious breath sounds Heart: regular rate and rhythm.   Abdomen: soft, non-distended; normal active bowel sounds; TTP over LUQ and LLQ; nontender at McBurney's point; negative Murphy's sign; + guarding Back: LT sided CVA tenderness Extremities: no edema; symmetrical with no gross deformities Skin: warm and dry Neurologic: slow gait Psychological: alert and cooperative; normal mood and affect  LABS: Results for orders placed or performed during the hospital encounter of 01/10/20 (from the past 24 hour(s))  POCT urinalysis dipstick     Status: None   Collection Time: 01/10/20 10:07 AM  Result Value Ref Range   Color, UA yellow yellow   Clarity, UA clear clear   Glucose, UA negative negative mg/dL   Bilirubin, UA negative negative   Ketones, POC UA negative negative mg/dL   Spec Grav, UA 5.400 8.676 - 1.025   Blood, UA negative negative   pH, UA 7.5 5.0 - 8.0   Protein Ur, POC negative negative mg/dL   Urobilinogen, UA 0.2 0.2 or 1.0 E.U./dL   Nitrite, UA Negative Negative   Leukocytes, UA Negative Negative     ASSESSMENT & PLAN:  1. Encounter for screening for COVID-19   2. LLQ abdominal pain   3. LUQ abdominal pain   4. Left flank pain     Meds ordered this encounter  Medications  . ondansetron (ZOFRAN) 4 MG tablet    Sig: Take 1 tablet (4 mg total) by mouth every 6 (six) hours.    Dispense:  12 tablet    Refill:  0    Order Specific Question:   Supervising Provider    Answer:   Eustace Moore [[id number]]  . dicyclomine (BENTYL) 20 MG tablet     Sig: Take 1 tablet (20 mg total) by mouth 2 (two) times daily.    Dispense:  20 tablet    Refill:  0    Order Specific Question:   Supervising Provider    Answer:   Eustace Moore [[id number]]  . alum & mag hydroxide-simeth (MAALOX/MYLANTA) 200-200-20 MG/5ML suspension 30 mL   Urine without signs of infection or blood, which would suggest kidney stone.   Unable to  rule out kidney stones, diverticulitis, hernia, etc... in urgent care setting.  Offered patient further evaluation and management in the ED.  Patient declines at this time and would like to try outpatient therapy first.  Aware of the risk associated with this decision including missed diagnosis, organ damage, organ failure, and/or death.  Patient aware and in agreement.     GI cocktail given in office Get rest and drink fluids Zofran prescribed for nausea.  Take as directed.   Bentyl prescribed.  Take as directed for stomach upset Follow up with PCP for further evaluation and management If you experience new or worsening symptoms return or go to ER such as fever, chills, nausea, vomiting, diarrhea, bloody or dark tarry stools, constipation, urinary symptoms, worsening abdominal discomfort, symptoms that do not improve with medications, inability to keep fluids down, etc...  Reviewed expectations re: course of current medical issues. Questions answered. Outlined signs and symptoms indicating need for more acute intervention. Patient verbalized understanding. After Visit Summary given.   Rennis Harding, PA-C 01/10/20 1015

## 2020-01-11 LAB — NOVEL CORONAVIRUS, NAA: SARS-CoV-2, NAA: NOT DETECTED

## 2020-01-11 LAB — SARS-COV-2, NAA 2 DAY TAT

## 2020-01-12 LAB — URINE CULTURE: Culture: NO GROWTH

## 2020-01-16 ENCOUNTER — Other Ambulatory Visit: Payer: Self-pay

## 2020-01-16 ENCOUNTER — Encounter (HOSPITAL_COMMUNITY): Payer: Self-pay

## 2020-01-16 DIAGNOSIS — M25522 Pain in left elbow: Secondary | ICD-10-CM | POA: Diagnosis not present

## 2020-01-16 DIAGNOSIS — R9431 Abnormal electrocardiogram [ECG] [EKG]: Secondary | ICD-10-CM | POA: Diagnosis not present

## 2020-01-16 DIAGNOSIS — R0602 Shortness of breath: Secondary | ICD-10-CM | POA: Diagnosis not present

## 2020-01-16 DIAGNOSIS — Z20822 Contact with and (suspected) exposure to covid-19: Secondary | ICD-10-CM | POA: Diagnosis not present

## 2020-01-16 DIAGNOSIS — M25561 Pain in right knee: Secondary | ICD-10-CM | POA: Diagnosis not present

## 2020-01-16 DIAGNOSIS — M25562 Pain in left knee: Secondary | ICD-10-CM | POA: Insufficient documentation

## 2020-01-16 DIAGNOSIS — R42 Dizziness and giddiness: Secondary | ICD-10-CM | POA: Insufficient documentation

## 2020-01-16 DIAGNOSIS — M25521 Pain in right elbow: Secondary | ICD-10-CM | POA: Insufficient documentation

## 2020-01-16 DIAGNOSIS — F1590 Other stimulant use, unspecified, uncomplicated: Secondary | ICD-10-CM | POA: Diagnosis not present

## 2020-01-16 NOTE — ED Triage Notes (Signed)
Pt to er, pt c/o knee, elbow and joint pain, states that in addition to the joint pain he is also having some dizziness for the past week.  States that a couple days ago he had a fever, states that he was seen in urgent care and was covid negative, pt states that he got so dizzy on Saturday and Sunday he fell down.

## 2020-01-17 ENCOUNTER — Emergency Department (HOSPITAL_COMMUNITY): Payer: Medicaid Other

## 2020-01-17 ENCOUNTER — Emergency Department (HOSPITAL_BASED_OUTPATIENT_CLINIC_OR_DEPARTMENT_OTHER): Payer: Medicaid Other

## 2020-01-17 ENCOUNTER — Emergency Department (HOSPITAL_COMMUNITY)
Admission: EM | Admit: 2020-01-17 | Discharge: 2020-01-17 | Disposition: A | Discharge status: Home / Self Care | Payer: Medicaid Other | Attending: Emergency Medicine | Admitting: Emergency Medicine

## 2020-01-17 DIAGNOSIS — R0602 Shortness of breath: Secondary | ICD-10-CM

## 2020-01-17 DIAGNOSIS — R9431 Abnormal electrocardiogram [ECG] [EKG]: Secondary | ICD-10-CM

## 2020-01-17 DIAGNOSIS — M25561 Pain in right knee: Secondary | ICD-10-CM

## 2020-01-17 DIAGNOSIS — M25522 Pain in left elbow: Secondary | ICD-10-CM

## 2020-01-17 DIAGNOSIS — R42 Dizziness and giddiness: Secondary | ICD-10-CM

## 2020-01-17 LAB — RESP PANEL BY RT-PCR (FLU A&B, COVID) ARPGX2
Influenza A by PCR: NEGATIVE
Influenza B by PCR: NEGATIVE
SARS Coronavirus 2 by RT PCR: NEGATIVE

## 2020-01-17 LAB — ECHOCARDIOGRAM COMPLETE
Area-P 1/2: 4.1 cm2
S' Lateral: 2.68 cm

## 2020-01-17 LAB — CBC
HCT: 46.3 % (ref 39.0–52.0)
Hemoglobin: 15.4 g/dL (ref 13.0–17.0)
MCH: 28.6 pg (ref 26.0–34.0)
MCHC: 33.3 g/dL (ref 30.0–36.0)
MCV: 85.9 fL (ref 80.0–100.0)
Platelets: 396 10*3/uL (ref 150–400)
RBC: 5.39 MIL/uL (ref 4.22–5.81)
RDW: 11.9 % (ref 11.5–15.5)
WBC: 7.6 10*3/uL (ref 4.0–10.5)
nRBC: 0 % (ref 0.0–0.2)

## 2020-01-17 LAB — URINALYSIS, ROUTINE W REFLEX MICROSCOPIC
Bilirubin Urine: NEGATIVE
Glucose, UA: NEGATIVE mg/dL
Hgb urine dipstick: NEGATIVE
Ketones, ur: NEGATIVE mg/dL
Leukocytes,Ua: NEGATIVE
Nitrite: NEGATIVE
Protein, ur: NEGATIVE mg/dL
Specific Gravity, Urine: 1.02 (ref 1.005–1.030)
pH: 5 (ref 5.0–8.0)

## 2020-01-17 LAB — BASIC METABOLIC PANEL
Anion gap: 8 (ref 5–15)
BUN: 20 mg/dL (ref 6–20)
CO2: 28 mmol/L (ref 22–32)
Calcium: 9.8 mg/dL (ref 8.9–10.3)
Chloride: 101 mmol/L (ref 98–111)
Creatinine, Ser: 0.81 mg/dL (ref 0.61–1.24)
GFR, Estimated: >60 mL/min (ref >60)
Glucose, Bld: 87 mg/dL (ref 70–99)
Potassium: 3.7 mmol/L (ref 3.5–5.1)
Sodium: 137 mmol/L (ref 135–145)

## 2020-01-17 LAB — TROPONIN I (HIGH SENSITIVITY)
Troponin I (High Sensitivity): 3 ng/L (ref <18)
Troponin I (High Sensitivity): <2 ng/L (ref <18)

## 2020-01-17 MED ORDER — IBUPROFEN 400 MG PO TABS
600.0000 mg | ORAL_TABLET | Freq: Once | ORAL | Status: AC
  Administered 2020-01-17: 600 mg via ORAL
  Filled 2020-01-17: qty 2

## 2020-01-17 NOTE — ED Notes (Signed)
Gave pt drink/snack.

## 2020-01-17 NOTE — ED Provider Notes (Signed)
Patient's echocardiogram without any significant abnormalities.  Repeat EKG appeared to normalize some.  Discussed with Dr. Simona Huh cardiology and they want to see the patient as an outpatient for stress test.  They did review his EKGs.  I do agree that it is a bit unusual.  Patient has remained asymptomatic.  Stable for discharge home from cardiology standpoint.  Patient's troponins without any significant elevations.  Patient also has follow-up with orthopedics.   Vanetta Mulders, MD 01/17/20 1309

## 2020-01-17 NOTE — ED Provider Notes (Addendum)
[facility] EMERGENCY DEPARTMENT Provider Note   CSN: [id number] Arrival date & time: 01/16/20  1720   Time seen 4:34 AM  History Chief Complaint  Patient presents with   Dizziness    Fred Santos is a 29 y.o. male.  HPI   Patient states about a week ago he started feeling dizzy and lightheaded feeling like he was going to fall especially in the mornings and it would get better after he would eat lunch.  He also states he has had chronic pain in his right knee and has been told he has a cartilage issue in that knee.  He states last year he was sent to physical therapy by [facility] medical who used to be his physicians.  He states they worked on strengthening his hip and that seemed to help.  He reports however the past week his right knee is getting worse.  He complains of pain behind his kneecap and he gets a sharp pain and then it will buckle.  He also has started having the same thing happen on his left knee.  He also has been having pain in his elbows and states he gets a sharp pain in the posterior portion of his elbow and it makes his arms drop.  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  It does not matter of the weight of the block.  He denies any hand numbness.  He states he has normal strength in his hands.  He states last week 1 time he had a temperature of 100.  He states he feels like he is getting chilled easier.  He has tried no medications.  Patient states there is arthritis in his family, his maternal grandfather, father and paternal grandmother had arthritis but he does not know what kind.  He also states his mother has diabetes.  Patient reports a lot of stress.  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  He states money  has been short and he has not been eating regularly.  He states he drinks a protein shake in the morning and eats lunch and he states he eats a full meal for dinner but he cannot tell me what he eats.  PCP Sharlene Dory, DO   Past Medical History:  Diagnosis Date   Allergy    Anxiety    GERD (gastroesophageal reflux disease)    Insomnia     Patient Active Problem List   Diagnosis Date Noted   Arthralgia of left hand 06/30/2019   GAD (generalized anxiety disorder) 08/06/2017   Gastroesophageal reflux disease 06/17/2017   Sleep disorder 06/17/2017    Past Surgical History:  Procedure Laterality Date   APPENDECTOMY         Family History  Problem Relation Age of Onset   Healthy Mother    Hypertension Mother    Diabetes Mother    Healthy Father     Social History   Tobacco Use   Smoking status: Never Smoker   Smokeless tobacco: Never Used  Building services engineer Use: Some days  Substance Use Topics   Alcohol use: Not Currently   Drug use: Yes    Types: Marijuana  employed  Home  Medications Prior to Admission medications   Medication Sig Start Date End Date Taking? Authorizing Provider  dicyclomine (BENTYL) 20 MG tablet Take 1 tablet (20 mg total) by mouth 2 (two) times daily. 01/10/20   Wurst, GrenadaBrittany, PA-C  esomeprazole (NEXIUM) 20 MG capsule Take 20 mg by mouth daily at 12 noon.    [provider]  hydrOXYzine (ATARAX/VISTARIL) 25 MG tablet Take 1 tablet (25 mg total) by mouth every 6 (six) hours as needed for anxiety. 05/27/19   Lorelee NewGreen, Garrett L, PA-C  ibuprofen (ADVIL) 600 MG tablet Take 1 tablet (600 mg total) by mouth every 6 (six) hours as needed. 06/28/19   Elson AreasSofia, Leslie K, PA-C  ibuprofen (ADVIL) 800 MG tablet Take 1 tablet (800 mg total) by mouth every 8 (eight) hours as needed. 06/28/19   Elson AreasSofia, Leslie K, PA-C  meloxicam (MOBIC) 7.5 MG tablet  10/11/18   [provider]  mirtazapine (REMERON) 30 MG tablet Take 30  mg by mouth at bedtime.    [provider]  mupirocin cream (BACTROBAN) 2 % Apply 1 application topically 2 (two) times daily. 09/05/19   Palumbo, April, MD  ondansetron (ZOFRAN ODT) 4 MG disintegrating tablet Take 1 tablet (4 mg total) by mouth every 8 (eight) hours as needed for nausea or vomiting. 05/27/19   Lorelee NewGreen, Garrett L, PA-C  ondansetron (ZOFRAN) 4 MG tablet Take 1 tablet (4 mg total) by mouth every 6 (six) hours. 01/10/20   Wurst, [place]Brittany, PA-C  omeprazole (PRILOSEC) 20 MG capsule Take 1 capsule (20 mg total) by mouth daily. 09/17/17 01/10/20  Sharlene DoryWendling, Nicholas Paul, DO    Allergies    Patient has no known allergies.  Review of Systems   Review of Systems  All other systems reviewed and are negative.   Physical Exam Updated Vital Signs BP 124/85    Pulse 72    Temp 98.1 F (36.7 C) (Oral)    Resp 20    Ht 5\' 10"  (1.778 m)    Wt 81.6 kg    SpO2 98%    BMI 25.83 kg/m   Physical Exam Vitals and nursing note reviewed.  Constitutional:      General: He is not in acute distress.    Appearance: Normal appearance. He is normal weight.  HENT:     Head: Normocephalic and atraumatic.     Right Ear: External ear normal.     Left Ear: External ear normal.     Mouth/Throat:     Mouth: Mucous membranes are dry.  Eyes:     Extraocular Movements: Extraocular movements intact.     Conjunctiva/sclera: Conjunctivae normal.     Pupils: Pupils are equal, round, and reactive to light.  Cardiovascular:     Rate and Rhythm: Normal rate and regular rhythm.     Pulses: Normal pulses.     Heart sounds: Normal heart sounds.  Pulmonary:     Effort: Pulmonary effort is normal. No respiratory distress.     Breath sounds: Normal breath sounds.  Musculoskeletal:     Cervical back: Normal range of motion.     Comments: When I look at patient's knees there is no obvious swelling seen.  When I palpate his knees I do not appreciate any joint effusions.  He states it hurts when I rock his  patella on the right, it does not hurt when I do the same thing on the left.  On exam of his elbows there is no obvious swelling.  I do  not appreciate a joint effusion.  He has no pain on flexion and extension however on extreme supination he states it hurts in his elbow.  Skin:    General: Skin is warm and dry.     Findings: No rash.  Neurological:     General: No focal deficit present.     Mental Status: He is alert and oriented to person, place, and time.     Cranial Nerves: No cranial nerve deficit.  Psychiatric:        Mood and Affect: Mood normal.        Behavior: Behavior normal.        Thought Content: Thought content normal.    Orthostatic VS for the past 24 hrs:  BP- Lying Pulse- Lying BP- Sitting Pulse- Sitting BP- Standing at 0 minutes Pulse- Standing at 0 minutes  01/17/20 0718 132/81 84 149/89 84 141/90 105    Orthostatics positive by pulses  ED Results / Procedures / Treatments   Labs (all labs ordered are listed, but only abnormal results are displayed) Results for orders placed or performed during the hospital encounter of 01/17/20  Basic metabolic panel  Result Value Ref Range   Sodium 137 135 - 145 mmol/L   Potassium 3.7 3.5 - 5.1 mmol/L   Chloride 101 98 - 111 mmol/L   CO2 28 22 - 32 mmol/L   Glucose, Bld 87 70 - 99 mg/dL   BUN 20 6 - 20 mg/dL   Creatinine, Ser 1.61 0.61 - 1.24 mg/dL   Calcium 9.8 8.9 - 09.6 mg/dL   GFR, Estimated >04 >54 mL/min   Anion gap 8 5 - 15  CBC  Result Value Ref Range   WBC 7.6 4.0 - 10.5 K/uL   RBC 5.39 4.22 - 5.81 MIL/uL   Hemoglobin 15.4 13.0 - 17.0 g/dL   HCT 09.8 39 - 52 %   MCV 85.9 80.0 - 100.0 fL   MCH 28.6 26.0 - 34.0 pg   MCHC 33.3 30.0 - 36.0 g/dL   RDW 11.9 14.7 - 82.9 %   Platelets 396 150 - 400 K/uL   nRBC 0.0 0.0 - 0.2 %   Laboratory interpretation all normal except troponin pending    #1  EKG EKG Interpretation  Date/Time:  Monday January 16 2020 17:30:09 EST Ventricular Rate:  73 PR  Interval:  162 QRS Duration: 100 QT Interval:  352 QTC Calculation: 387 R Axis:   66 Text Interpretation: Normal sinus rhythm with sinus arrhythmia Incomplete right bundle branch block Inferior infarct , age undetermined Anterolateral infarct , age undetermined Since last tracing 29 May 2018 T wave inversion now evident in Septal leads Inferolateral leads Confirmed by Devoria Albe ([id number]) on 01/17/2020 5:12:15 AM   Radiology DG Chest 2 View  Result Date: 01/17/2020 CLINICAL DATA:  Shortness of breath.  Abnormal EKG. EXAM: CHEST - 2 VIEW COMPARISON:  05/29/2018. FINDINGS: Mediastinum and hilar structures normal. Low lung volumes with mild bibasilar atelectasis. Mild bibasilar infiltrates cannot be excluded. No pleural effusion or pneumothorax. Heart size normal. No acute bony abnormality. IMPRESSION: Low lung volumes with mild bibasilar atelectasis. Mild bibasilar infiltrates cannot be excluded. Electronically Signed   By: Maisie Fus  Register   On: 01/17/2020 07:11   DG Elbow Complete Left  Result Date: 01/17/2020 CLINICAL DATA:  Left elbow pain EXAM: LEFT ELBOW - COMPLETE 3+ VIEW COMPARISON:  None. FINDINGS: There is no evidence of fracture, dislocation, or joint effusion. There is no evidence of arthropathy or other  focal bone abnormality. Soft tissues are unremarkable. IMPRESSION: Negative. Electronically Signed   By: Helyn Numbers MD   On: 01/17/2020 06:05   DG Elbow Complete Right  Result Date: 01/17/2020 CLINICAL DATA:  Elbow pain.  No known injury. EXAM: RIGHT ELBOW - COMPLETE 3+ VIEW COMPARISON:  No prior. FINDINGS: There is no evidence of fracture, dislocation, or joint effusion. There is no evidence of arthropathy or other focal bone abnormality. Soft tissues are unremarkable. IMPRESSION: No acute abnormality identified. Electronically Signed   By: Maisie Fus  Register   On: 01/17/2020 06:03   DG Knee Complete 4 Views Left  Result Date: 01/17/2020 CLINICAL DATA:  Left knee pain EXAM: LEFT  KNEE - COMPLETE 4+ VIEW COMPARISON:  None. FINDINGS: No evidence of fracture, dislocation, or joint effusion. 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 No evidence of arthropathy or other focal bone abnormality. Soft tissues are unremarkable. IMPRESSION: Negative. Electronically Signed   By: Helyn Numbers MD   On: 01/17/2020 06:05   DG Knee Complete 4 Views Right  Result Date: 01/17/2020 CLINICAL DATA:  Right knee pain EXAM: RIGHT KNEE - COMPLETE 4+ VIEW COMPARISON:  None. FINDINGS: No evidence of fracture, dislocation, or joint effusion. No evidence of arthropathy. 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 Soft tissues are unremarkable. IMPRESSION: Negative. Electronically Signed   By: Helyn Numbers MD   On: 01/17/2020 06:08    Procedures Procedures (including critical care time)  Medications Ordered in ED Medications  ibuprofen (ADVIL) tablet 600 mg (600 mg Oral Given 01/17/20 0601)    ED Course  I have reviewed the triage vital signs and the nursing notes.  Pertinent labs & imaging results that were available during my care of the patient were reviewed by me and considered in my medical decision making (see chart for details).    MDM Rules/Calculators/A&P                          Patient's labs returned that his blood sugar was 87, he was given food to eat.  After reviewing patient's EKG he states he has noticed some shortness of breath in the morning that gets better during the day. He also had a low-grade fever of 101-day last week. He has a mild cough. He denies chest pain. Chest x-ray PA lateral was ordered.  Covid testing was ordered.  We discussed his x-rays of his elbows and knees and  he was aware of the knee bipartite patella but unsure about the the nonossifying fibroma.   6:53 AM patient was discussed with Dr.Acharya, cardiology.  She recommends repeating the EKG, getting an echocardiogram and consult the cardiologist who will be here this morning at 9 AM.She questioned whether he had had a covid vaccine lately.   This information was relayed to the patient.  He denies taking the Covid vaccine or being around anybody who is sick.  His main concern is that his girlfriend is at home and she is pregnant probably 8 weeks and he is not sure if there is any food in the house.  At change of shift patient's troponin, repeat EKG, covid test, echocardiogram, and inhouse cardiology consult is pending. Pt turned over to Dr Deretha Emory for disposition.  Final Clinical Impression(s) / ED Diagnoses Final diagnoses:  Pain in both knees, unspecified chronicity  Pain of both elbows  Dizziness  Shortness of breath  Abnormal EKG    Rx / DC Orders  Disposition pending  Devoria Albe, MD, Concha Pyo, MD 01/17/20 4270    Devoria Albe, MD 01/17/20 [phone]

## 2020-01-17 NOTE — ED Notes (Signed)
Called lab and informed of troponin order for 0513, per lab not collected yet.

## 2020-01-17 NOTE — Discharge Instructions (Addendum)
You can follow up with Dr Dallas Schimke, orthopedics, for your joint pains, call his office to get an appointment.   Cleared by cardiology for discharge home.  But they want you to follow-up in their clinic.  You can stop on the way out and make an appointment.  Information provided above.  They are thinking in terms of doing cardiac stress test to further evaluate the heart situation.

## 2020-01-17 NOTE — ED Notes (Addendum)
Patient transported to XR. 

## 2020-01-17 NOTE — ED Notes (Signed)
Gave pt meal ?

## 2020-01-17 NOTE — Progress Notes (Signed)
*  PRELIMINARY RESULTS* Echocardiogram 2D Echocardiogram has been performed.  Fred Santos 01/17/2020, 9:14 AM

## 2020-01-18 ENCOUNTER — Telehealth: Payer: Self-pay | Admitting: Cardiology

## 2020-01-18 ENCOUNTER — Encounter: Payer: Self-pay | Admitting: *Deleted

## 2020-01-18 ENCOUNTER — Ambulatory Visit (INDEPENDENT_AMBULATORY_CARE_PROVIDER_SITE_OTHER): Payer: Medicaid Other | Admitting: Cardiology

## 2020-01-18 ENCOUNTER — Encounter: Payer: Self-pay | Admitting: Cardiology

## 2020-01-18 VITALS — BP 128/72 | HR 93 | Ht 70.0 in | Wt 180.0 lb

## 2020-01-18 DIAGNOSIS — R9431 Abnormal electrocardiogram [ECG] [EKG]: Secondary | ICD-10-CM | POA: Diagnosis not present

## 2020-01-18 DIAGNOSIS — R079 Chest pain, unspecified: Secondary | ICD-10-CM | POA: Diagnosis not present

## 2020-01-18 NOTE — Progress Notes (Signed)
Cardiology Office Note   Date:  01/18/2020   ID:  Fred Santos, DOB 12-11-1990, MRN [id number]  PCP:  Sharlene Dory, DO  Cardiologist:   Rollene Rotunda, MD Referring:  ED  Chief Complaint  Patient presents with  . Dizziness      History of Present Illness: Fred Santos is a 29 y.o. male who presents for evaluation of an abnormal EKG.  He went to the emergency room actually yesterday because of knee and joint problems.  He has been hurting in his knee for a while but when his elbow started hurting he wanted to be seen in the emergency room.  While he was there he mentions some dizziness.  That led to an EKG.  He was initially abnormal.  There was an incomplete right bundle branch block.  He had some T wave inversions in the anterior and lateral leads.  They did talk with cardiology but a consultation was not done.  He had an echocardiogram which was essentially unremarkable.  He did have sinus arrhythmia.  Follow-up EKG demonstrated no evidence of an incomplete right bundle branch block although he still had some right axis deviation.  The T wave inversions have resolved.  He does not describe chest discomfort.  He works as a Hospital doctor.  He does not get chest pressure, neck or arm discomfort.  He does not have shortness of breath, PND or orthopnea.  He does not really notice palpitations.  He says he has dizziness which is not orthostatic.  (In fact in the emergency room he had no evidence of orthostatic hypotension.)  He says the dizziness is a sensation like he has difficulty standing straight.  He does mention that at times he has had some rapid heart rates.  It happens sporadically.  He says he has had heart rates in the 200s rarely.  He has never had any cardiac work-up.   Past Medical History:  Diagnosis Date  . Allergy   . Anxiety   . GERD (gastroesophageal reflux disease)   . Insomnia     Past Surgical History:  Procedure Laterality Date  . APPENDECTOMY        Current Outpatient Medications  Medication Sig Dispense Refill  . acetaminophen (TYLENOL) 325 MG tablet Take 325 mg by mouth every 4 (four) hours as needed for headache.    . dicyclomine (BENTYL) 20 MG tablet Take 1 tablet (20 mg total) by mouth 2 (two) times daily. 20 tablet 0  . esomeprazole (NEXIUM) 20 MG capsule Take 20 mg by mouth daily at 12 noon.    Marland Kitchen ibuprofen (ADVIL) 600 MG tablet Take 1 tablet (600 mg total) by mouth every 6 (six) hours as needed. 30 tablet 0  . ibuprofen (ADVIL) 800 MG tablet Take 1 tablet (800 mg total) by mouth every 8 (eight) hours as needed. 30 tablet 0  . mupirocin cream (BACTROBAN) 2 % Apply 1 application topically 2 (two) times daily. 15 g 0   No current facility-administered medications for this visit.    Allergies:   Patient has no known allergies.    Social History:  The patient  reports that he has never smoked. He has never used smokeless tobacco. He reports previous alcohol use. He reports current drug use. Drug: Marijuana.   Family History:  The patient's family history includes Diabetes in his mother; Healthy in his father and mother; Hypertension in his mother.    ROS:  Please see the history of present  illness.   Otherwise, review of systems are positive for none.   All other systems are reviewed and negative.    PHYSICAL EXAM: VS:  BP 128/72   Pulse 93   Ht 5\' 10"  (1.778 m)   Wt 180 lb (81.6 kg)   SpO2 96%   BMI 25.83 kg/m  , BMI Body mass index is 25.83 kg/m. GENERAL:  Well appearing HEENT:  Pupils equal round and reactive, fundi not visualized, oral mucosa unremarkable NECK:  No jugular venous distention, waveform within normal limits, carotid upstroke brisk and symmetric, no bruits, no thyromegaly LYMPHATICS:  No cervical, inguinal adenopathy LUNGS:  Clear to auscultation bilaterally BACK:  No CVA tenderness CHEST:  Unremarkable HEART:  PMI not displaced or sustained,S1 and S2 within normal limits, no S3, no S4, no  clicks, no rubs, no murmurs ABD:  Flat, positive bowel sounds normal in frequency in pitch, no bruits, no rebound, no guarding, no midline pulsatile mass, no hepatomegaly, no splenomegaly EXT:  2 plus pulses throughout, no edema, no cyanosis no clubbing SKIN:  No rashes no nodules NEURO:  Cranial nerves II through XII grossly intact, motor grossly intact throughout PSYCH:  Cognitively intact, oriented to person place and time    EKG:  EKG is ordered today. The ekg ordered today demonstrates sinus rhythm, rate 94, sinus arrhythmia, axis within normal limits, intervals within normal limits, no acute ST-T wave changes.   Recent Labs: 05/27/2019: ALT 78 01/16/2020: BUN 20; Creatinine, Ser 0.81; Hemoglobin 15.4; Platelets 396; Potassium 3.7; Sodium 137    Lipid Panel No results found for: CHOL, TRIG, HDL, CHOLHDL, VLDL, LDLCALC, LDLDIRECT    Wt Readings from Last 3 Encounters:  01/18/20 180 lb (81.6 kg)  01/16/20 180 lb (81.6 kg)  09/04/19 198 lb (89.8 kg)      Other studies Reviewed: Additional studies/ records that were reviewed today include: ED records. Review of the above records demonstrates:  Please see elsewhere in the note.     ASSESSMENT AND PLAN:  DIZZINESS: His symptoms are infrequent.  He is going to get an 09/06/19 today which I think is the best way to monitor for any arrhythmias.  We talked about how he can send this to me on my chart.  ABNORMAL EKG: He seems to have a transient bundle branch block with some T wave changes that was picked up incidentally.  I going to screen him with a POET (Plain Old Exercise Treadmill)   Current medicines are reviewed at length with the patient today.  The patient does not have concerns regarding medicines.  The following changes have been made:  no change  Labs/ tests ordered today include:   Orders Placed This Encounter  Procedures  . EXERCISE TOLERANCE TEST (ETT)  . EKG 12-Lead     Disposition:   FU with as  needed   Signed, [employer], MD  01/18/2020 5:28 PM    [facility]

## 2020-01-18 NOTE — Patient Instructions (Addendum)
Medication Instructions:   Your physician recommends that you continue on your current medications as directed. Please refer to the Current Medication list given to you today.  Labwork:  None  Testing/Procedures: Your physician has requested that you have an exercise tolerance test. For further information please visit [email or web address] Please also follow instruction sheet, as given.  Follow-Up:  Your physician recommends that you schedule a follow-up appointment in: as needed.   Any Other Special Instructions Will Be Listed Below (If Applicable).  If you need a refill on your cardiac medications before your next appointment, please call your pharmacy.

## 2020-01-18 NOTE — Telephone Encounter (Signed)
Pre-cert Verification for the following procedure    GXT  DATE:  02/27/2020  LOCATION: [facility]  

## 2020-02-20 ENCOUNTER — Encounter (HOSPITAL_COMMUNITY): Payer: Medicaid Other

## 2020-02-24 ENCOUNTER — Other Ambulatory Visit (HOSPITAL_COMMUNITY)
Admission: RE | Admit: 2020-02-24 | Discharge: 2020-02-24 | Disposition: A | Discharge status: Home / Self Care | Payer: Medicaid Other | Source: Ambulatory Visit | Attending: Cardiology | Admitting: Cardiology

## 2020-02-24 ENCOUNTER — Other Ambulatory Visit: Payer: Self-pay

## 2020-02-24 DIAGNOSIS — Z20822 Contact with and (suspected) exposure to covid-19: Secondary | ICD-10-CM | POA: Insufficient documentation

## 2020-02-24 DIAGNOSIS — Z01812 Encounter for preprocedural laboratory examination: Secondary | ICD-10-CM | POA: Diagnosis present

## 2020-02-25 LAB — SARS CORONAVIRUS 2 (TAT 6-24 HRS): SARS Coronavirus 2: NEGATIVE

## 2020-02-27 ENCOUNTER — Ambulatory Visit (HOSPITAL_COMMUNITY)
Admission: RE | Admit: 2020-02-27 | Discharge: 2020-02-27 | Disposition: A | Discharge status: Home / Self Care | Payer: Medicaid Other | Source: Ambulatory Visit | Attending: Cardiology | Admitting: Cardiology

## 2020-02-27 ENCOUNTER — Other Ambulatory Visit: Payer: Self-pay

## 2020-02-27 DIAGNOSIS — R9431 Abnormal electrocardiogram [ECG] [EKG]: Secondary | ICD-10-CM | POA: Insufficient documentation

## 2020-02-27 DIAGNOSIS — R079 Chest pain, unspecified: Secondary | ICD-10-CM

## 2020-02-27 LAB — EXERCISE TOLERANCE TEST
Estimated workload: 13.4 METS
Exercise duration (min): 11 min
Exercise duration (sec): 0 s
MPHR: 191 {beats}/min
Peak HR: 193 {beats}/min
Percent HR: 101 %
RPE: 17
Rest HR: 74 {beats}/min

## 2020-03-09 ENCOUNTER — Ambulatory Visit: Payer: Medicaid Other | Admitting: Cardiology

## 2022-01-28 ENCOUNTER — Telehealth: Payer: Self-pay | Admitting: *Deleted

## 2022-01-28 NOTE — Telephone Encounter (Signed)
[facility] on Pen desk

## 2022-01-29 NOTE — Telephone Encounter (Signed)
Disc given back to Fred Santos to file

## 2022-06-02 ENCOUNTER — Encounter: Payer: Self-pay | Admitting: *Deleted
